# Patient Record
Sex: Male | Born: 1985 | Race: White | Hispanic: No | Marital: Single | Smoking: Current every day smoker
Health system: Southern US, Community
[De-identification: ages and names within clinical notes are randomized; demographics above are authoritative.]

## PROBLEM LIST (undated history)

## (undated) DIAGNOSIS — Z8669 Personal history of other diseases of the nervous system and sense organs: Secondary | ICD-10-CM

## (undated) HISTORY — PX: TONSILLECTOMY: SUR1361

## (undated) HISTORY — PX: NO PAST SURGERIES: SHX2092

## (undated) HISTORY — PX: OTHER SURGICAL HISTORY: SHX169

---

## 2006-07-18 ENCOUNTER — Emergency Department: Payer: Self-pay | Admitting: Emergency Medicine

## 2011-10-22 ENCOUNTER — Emergency Department: Payer: Self-pay | Admitting: Emergency Medicine

## 2016-10-02 ENCOUNTER — Emergency Department
Admission: EM | Admit: 2016-10-02 | Discharge: 2016-10-02 | Disposition: A | Discharge status: Home / Self Care | Payer: Self-pay | Attending: Emergency Medicine | Admitting: Emergency Medicine

## 2016-10-02 ENCOUNTER — Emergency Department: Payer: Self-pay

## 2016-10-02 ENCOUNTER — Encounter: Payer: Self-pay | Admitting: Emergency Medicine

## 2016-10-02 DIAGNOSIS — F17210 Nicotine dependence, cigarettes, uncomplicated: Secondary | ICD-10-CM | POA: Insufficient documentation

## 2016-10-02 DIAGNOSIS — R319 Hematuria, unspecified: Secondary | ICD-10-CM | POA: Insufficient documentation

## 2016-10-02 HISTORY — DX: Personal history of other diseases of the nervous system and sense organs: Z86.69

## 2016-10-02 LAB — URINALYSIS COMPLETE WITH MICROSCOPIC (ARMC ONLY)
BILIRUBIN URINE: NEGATIVE
Bacteria, UA: NONE SEEN
GLUCOSE, UA: NEGATIVE mg/dL
NITRITE: NEGATIVE
PH: 6 (ref 5.0–8.0)
Protein, ur: NEGATIVE mg/dL
Specific Gravity, Urine: 1.023 (ref 1.005–1.030)

## 2016-10-02 LAB — CBC
HCT: 46.9 % (ref 40.0–52.0)
HEMOGLOBIN: 16.3 g/dL (ref 13.0–18.0)
MCH: 31.6 pg (ref 26.0–34.0)
MCHC: 34.9 g/dL (ref 32.0–36.0)
MCV: 90.7 fL (ref 80.0–100.0)
PLATELETS: 206 10*3/uL (ref 150–440)
RBC: 5.16 MIL/uL (ref 4.40–5.90)
RDW: 13.1 % (ref 11.5–14.5)
WBC: 5.5 10*3/uL (ref 3.8–10.6)

## 2016-10-02 LAB — BASIC METABOLIC PANEL
Anion gap: 9 (ref 5–15)
BUN: 10 mg/dL (ref 6–20)
CALCIUM: 9.1 mg/dL (ref 8.9–10.3)
CHLORIDE: 109 mmol/L (ref 101–111)
CO2: 22 mmol/L (ref 22–32)
CREATININE: 1.16 mg/dL (ref 0.61–1.24)
GFR calc non Af Amer: >60 mL/min (ref >60)
GLUCOSE: 84 mg/dL (ref 65–99)
Potassium: 4 mmol/L (ref 3.5–5.1)
Sodium: 140 mmol/L (ref 135–145)

## 2016-10-02 NOTE — Discharge Instructions (Signed)
Please seek medical attention for any high fevers, chest pain, shortness of breath, change in behavior, persistent vomiting, bloody stool or any other new or concerning symptoms.  

## 2016-10-02 NOTE — ED Provider Notes (Signed)
[facility] Emergency Department Provider Note   ____________________________________________   I have reviewed the triage vital signs and the nursing notes.   HISTORY  Chief Complaint Hematuria   History limited by: Not Limited   HPI Wayne Lane is a 30 y.o. male who presents to the emergency department today because of concern for bloody urination. The patient states that it started last night after intercourse. He went to urinate and felt like he initially had a hard time urinating and then notice blood in his urine. This morning he again noticed a small amount of blood. He denies any significant pain. Had similar symptoms once previously that resolved on its own. Denies any GI bleed, nosebleeds or bleeding disorder.   Past Medical History:  Diagnosis Date  . History of seizures as a child     There are no active problems to display for this patient.   History reviewed. No pertinent surgical history.  Prior to Admission medications   Not on File    Allergies Valium [diazepam]  History reviewed. No pertinent family history.  Social History Social History  Substance Use Topics  . Smoking status: Current Every Day Smoker    Types: Cigarettes  . Smokeless tobacco: Never Used  . Alcohol use Yes     Comment: 6 pack/week    Review of Systems  Constitutional: Negative for fever. Cardiovascular: Negative for chest pain. Respiratory: Negative for shortness of breath. Gastrointestinal: Negative for abdominal pain, vomiting and diarrhea. Genitourinary: Negative for dysuria. Positive for hematuria. Musculoskeletal: Negative for back pain. Skin: Negative for rash. Neurological: Negative for headaches, focal weakness or numbness.  10-point ROS otherwise negative.  ____________________________________________   PHYSICAL EXAM:  VITAL SIGNS: ED Triage Vitals  Enc Vitals Group     BP 10/02/16 1105 109/70     Pulse Rate 10/02/16 1105  88     Resp 10/02/16 1105 20     Temp 10/02/16 1105 98.2 F (36.8 C)     Temp Source 10/02/16 1105 Oral     SpO2 10/02/16 1105 97 %     Weight 10/02/16 1106 145 lb (65.8 kg)     Height 10/02/16 1106 6\' 2"  (1.88 m)   Constitutional: Alert and oriented. Well appearing and in no distress. Eyes: Conjunctivae are normal. Normal extraocular movements. ENT   Head: Normocephalic and atraumatic.   Nose: No congestion/rhinnorhea.   Mouth/Throat: Mucous membranes are moist.   Neck: No stridor. Hematological/Lymphatic/Immunilogical: No cervical lymphadenopathy. Cardiovascular: Normal rate, regular rhythm.  No murmurs, rubs, or gallops. Respiratory: Normal respiratory effort without tachypnea nor retractions. Breath sounds are clear and equal bilaterally. No wheezes/rales/rhonchi. Gastrointestinal: Soft and nontender. No distention.  Genitourinary: Deferred Musculoskeletal: Normal range of motion in all extremities. No lower extremity edema. Neurologic:  Normal speech and language. No gross focal neurologic deficits are appreciated.  Skin:  Skin is warm, dry and intact. No rash noted. Psychiatric: Mood and affect are normal. Speech and behavior are normal. Patient exhibits appropriate insight and judgment.  ____________________________________________    LABS (pertinent positives/negatives)  Labs Reviewed  URINALYSIS COMPLETEWITH MICROSCOPIC ([facility] ONLY) - Abnormal; Notable for the following:       Result Value   Color, Urine YELLOW (*)    APPearance CLEAR (*)    Ketones, ur TRACE (*)    Hgb urine dipstick 1+ (*)    Leukocytes, UA TRACE (*)    Squamous Epithelial / LPF 0-5 (*)    All other components within normal limits  BASIC METABOLIC PANEL  CBC     ____________________________________________   EKG  None  ____________________________________________    RADIOLOGY  US renal   IMPRESSION:  Normal renal ultrasound for age.      ____________________________________________   PROCEDURES  Procedures  ____________________________________________   INITIAL IMPRESSION / ASSESSMENT AND PLAN / ED COURSE  Pertinent labs & imaging results that were available during my care of the patient were reviewed by me and considered in my medical decision making (see chart for details).  Patient here with gross hematuria. No clinical history suggestive of stones. UA here with some RBCs. Did obtain US renal without any concerning findings. At this point unclear etiology of the hematuria. Patient denies any dysuria or bad odor to suggest infection. Will discharge with urology follow up information.  ____________________________________________   FINAL CLINICAL IMPRESSION(S) / ED DIAGNOSES  Final diagnoses:  Hematuria, unspecified type     Note: This dictation was prepared with Dragon dictation. Any transcriptional errors that result from this process are unintentional    [name], MD 10/02/16 [phone]

## 2016-10-02 NOTE — ED Triage Notes (Signed)
Pt presents to ED c/o bright red bleeding and pain with urination intermittently since last night.

## 2016-10-02 NOTE — ED Notes (Signed)
Pt to u/s

## 2016-10-04 LAB — URINE CULTURE: Culture: <10000 — AB

## 2018-08-15 ENCOUNTER — Emergency Department
Admission: EM | Admit: 2018-08-15 | Discharge: 2018-08-15 | Disposition: A | Discharge status: Home / Self Care | Payer: Self-pay | Attending: Emergency Medicine | Admitting: Emergency Medicine

## 2018-08-15 ENCOUNTER — Emergency Department: Payer: Self-pay

## 2018-08-15 ENCOUNTER — Encounter: Payer: Self-pay | Admitting: Emergency Medicine

## 2018-08-15 DIAGNOSIS — Y939 Activity, unspecified: Secondary | ICD-10-CM | POA: Insufficient documentation

## 2018-08-15 DIAGNOSIS — S83412A Sprain of medial collateral ligament of left knee, initial encounter: Secondary | ICD-10-CM | POA: Insufficient documentation

## 2018-08-15 DIAGNOSIS — F17210 Nicotine dependence, cigarettes, uncomplicated: Secondary | ICD-10-CM | POA: Insufficient documentation

## 2018-08-15 DIAGNOSIS — X58XXXA Exposure to other specified factors, initial encounter: Secondary | ICD-10-CM | POA: Insufficient documentation

## 2018-08-15 DIAGNOSIS — Y929 Unspecified place or not applicable: Secondary | ICD-10-CM | POA: Insufficient documentation

## 2018-08-15 DIAGNOSIS — Y999 Unspecified external cause status: Secondary | ICD-10-CM | POA: Insufficient documentation

## 2018-08-15 MED ORDER — NAPROXEN 500 MG PO TABS: 500.0000 mg | ORAL_TABLET | Freq: Two times a day (BID) | ORAL | Status: DC

## 2018-08-15 MED ORDER — TRAMADOL HCL 50 MG PO TABS: 50.0000 mg | ORAL_TABLET | Freq: Two times a day (BID) | ORAL | 0 refills | Status: DC | PRN

## 2018-08-15 NOTE — ED Provider Notes (Signed)
[facility] Emergency Department Provider Note   ____________________________________________   First MD Initiated Contact with Patient 08/15/18 1044     (approximate)  I have reviewed the triage vital signs and the nursing notes.   HISTORY  Chief Complaint Knee Pain    HPI Wayne Lane is a 32 y.o. male patient presents with left knee pain which started yesterday.  Patient state no specific provocative incident for complaint.  Patient state pain started yesterday.  Patient states the pain is mainly medial aspect of his left knee.  Patient is able to ambulate with atypical gait.   Past Medical History:  Diagnosis Date  . History of seizures as a child     There are no active problems to display for this patient.   History reviewed. No pertinent surgical history.  Prior to Admission medications   Medication Sig Start Date End Date Taking? Authorizing Provider  naproxen (NAPROSYN) 500 MG tablet Take 1 tablet (500 mg total) by mouth 2 (two) times daily with a meal. 08/15/18   Joni Reining, PA-C  traMADol (ULTRAM) 50 MG tablet Take 1 tablet (50 mg total) by mouth every 12 (twelve) hours as needed. 08/15/18   Joni Reining, PA-C    Allergies Valium [diazepam]  No family history on file.  Social History Social History   Tobacco Use  . Smoking status: Current Every Day Smoker    Types: Cigarettes  . Smokeless tobacco: Never Used  Substance Use Topics  . Alcohol use: Yes    Comment: 6 pack/week  . Drug use: Yes    Types: Marijuana    Review of Systems Constitutional: No fever/chills Eyes: No visual changes. ENT: No sore throat. Cardiovascular: Denies chest pain. Respiratory: Denies shortness of breath. Gastrointestinal: No abdominal pain.  No nausea, no vomiting.  No diarrhea.  No constipation. Genitourinary: Negative for dysuria. Musculoskeletal: Left knee pain. Skin: Negative for rash. Neurological: Negative for headaches,  focal weakness or numbness.   ____________________________________________   PHYSICAL EXAM:  VITAL SIGNS: ED Triage Vitals  Enc Vitals Group     BP 08/15/18 1044 121/79     Pulse Rate 08/15/18 1044 73     Resp 08/15/18 1044 20     Temp 08/15/18 1044 98.4 F (36.9 C)     Temp Source 08/15/18 1044 Oral     SpO2 08/15/18 1044 100 %     Weight 08/15/18 1041 145 lb (65.8 kg)     Height 08/15/18 1041 6' (1.829 m)     Head Circumference --      Peak Flow --      Pain Score 08/15/18 1041 7     Pain Loc --      Pain Edu? --      Excl. in GC? --    Constitutional: Alert and oriented. Well appearing and in no acute distress. Cardiovascular: Normal rate, regular rhythm. Grossly normal heart sounds.  Good peripheral circulation. Respiratory: Normal respiratory effort.  No retractions. Lungs CTAB. Musculoskeletal: No obvious deformity of the left knee.  No obvious effusion or crepitus with palpation.  Patient has full and equal passive range of motion.  Moderate guarding palpation insertion point of the MCL. Neurologic:  Normal speech and language. No gross focal neurologic deficits are appreciated. No gait instability. Skin:  Skin is warm, dry and intact. No rash noted. Psychiatric: Mood and affect are normal. Speech and behavior are normal.  ____________________________________________   LABS (all labs ordered are  listed, but only abnormal results are displayed)  Labs Reviewed - No data to display ____________________________________________  EKG   ____________________________________________  RADIOLOGY  ED MD interpretation:    Official radiology report(s): Dg Knee Complete 4 Views Left  Result Date: 08/15/2018 CLINICAL DATA:  Pain without injury. EXAM: LEFT KNEE - COMPLETE 4+ VIEW COMPARISON:  None FINDINGS: No joint effusion. Mild sharpening of the tibial spines. The joint spaces appear well preserved. No fracture or dislocation. IMPRESSION: 1. Minimal degenerative  changes with mild sharpening of the tibial spines. 2. No acute findings. Electronically Signed   By: Signa Kell M.D.   On: 08/15/2018 11:08    ____________________________________________   PROCEDURES  Procedure(s) performed: None  Procedures  Critical Care performed: No  ____________________________________________   INITIAL IMPRESSION / ASSESSMENT AND PLAN / ED COURSE  As part of my medical decision making, I reviewed the following data within the electronic MEDICAL RECORD NUMBER    Left knee pain secondary to sprain.  Discussed negative x-ray findings with patient.  Patient placed in Ace wrap and given discharge care instruction.  Advised take medication as directed.  Patient advised to purchase over-the-counter elastic knee support to use while working.  Follow-up with orthopedic clinic if no improvement in 1 week.     ____________________________________________   FINAL CLINICAL IMPRESSION(S) / ED DIAGNOSES  Final diagnoses:  Sprain of medial collateral ligament of left knee, initial encounter     ED Discharge Orders         Ordered    naproxen (NAPROSYN) 500 MG tablet  2 times daily with meals     08/15/18 1123    traMADol (ULTRAM) 50 MG tablet  Every 12 hours PRN     08/15/18 1123           Note:  This document was prepared using Dragon voice recognition software and may include unintentional dictation errors.    Joni Reining, PA-C 08/15/18 1129    Sharman Cheek, MD 08/19/18 [phone]

## 2018-08-15 NOTE — Discharge Instructions (Signed)
Follow discharge care instruction wear elastic knee support while working.

## 2018-08-15 NOTE — ED Triage Notes (Signed)
Presents with left knee pain  States pain started yesterday  Unsure of injury  Pain is mainly lateral  Ambulates with slight limp

## 2018-10-24 ENCOUNTER — Other Ambulatory Visit: Payer: Self-pay

## 2018-10-24 ENCOUNTER — Encounter: Payer: Self-pay | Admitting: Emergency Medicine

## 2018-10-24 ENCOUNTER — Ambulatory Visit
Admission: EM | Admit: 2018-10-24 | Discharge: 2018-10-24 | Disposition: A | Discharge status: Home / Self Care | Payer: Self-pay | Attending: Family Medicine | Admitting: Family Medicine

## 2018-10-24 DIAGNOSIS — R69 Illness, unspecified: Secondary | ICD-10-CM | POA: Insufficient documentation

## 2018-10-24 DIAGNOSIS — J111 Influenza due to unidentified influenza virus with other respiratory manifestations: Secondary | ICD-10-CM

## 2018-10-24 LAB — RAPID INFLUENZA A&B ANTIGENS: Influenza A (ARMC): POSITIVE — AB

## 2018-10-24 LAB — RAPID INFLUENZA A&B ANTIGENS (ARMC ONLY): INFLUENZA B (ARMC): NEGATIVE

## 2018-10-24 MED ORDER — OSELTAMIVIR PHOSPHATE 75 MG PO CAPS: 75.0000 mg | ORAL_CAPSULE | Freq: Two times a day (BID) | ORAL | 0 refills | Status: DC

## 2018-10-24 NOTE — ED Triage Notes (Signed)
Pt c/o cough, congestion, fever (101 yesterday), and headache. Started  3-4 days ago.

## 2018-10-24 NOTE — ED Provider Notes (Signed)
[facility]    CSN: [id number] Arrival date & time: 10/24/18  1031     History   Chief Complaint Chief Complaint  Patient presents with  . Cough  . Fever    HPI Wayne Lane is a 32 y.o. male.   The history is provided by the patient.  Cough  Associated symptoms: fever and myalgias   Associated symptoms: no weight loss   Fever  Associated symptoms: congestion, cough and myalgias   URI  Presenting symptoms: congestion, cough, fatigue and fever   Severity:  Moderate Onset quality:  Sudden Duration:  2 days Timing:  Constant Progression:  Worsening Chronicity:  New Relieved by:  None tried Ineffective treatments:  None tried Associated symptoms: myalgias   Risk factors: sick contacts   Risk factors: not elderly, no chronic cardiac disease, no chronic kidney disease, no chronic respiratory disease, no diabetes mellitus, no immunosuppression, no recent illness and no recent travel     Past Medical History:  Diagnosis Date  . History of seizures as a child     There are no active problems to display for this patient.   Past Surgical History:  Procedure Laterality Date  . NO PAST SURGERIES         Home Medications    Prior to Admission medications   Medication Sig Start Date End Date Taking? Authorizing Provider  naproxen (NAPROSYN) 500 MG tablet Take 1 tablet (500 mg total) by mouth 2 (two) times daily with a meal. 08/15/18   Joni ReiningSmith, Ronald K, PA-C  oseltamivir (TAMIFLU) 75 MG capsule Take 1 capsule (75 mg total) by mouth 2 (two) times daily. 10/24/18   Payton Mccallumonty, Alaija Ruble, MD  traMADol (ULTRAM) 50 MG tablet Take 1 tablet (50 mg total) by mouth every 12 (twelve) hours as needed. 08/15/18   Joni ReiningSmith, Ronald K, PA-C    Family History Family History  Problem Relation Age of Onset  . Healthy Mother     Social History Social History   Tobacco Use  . Smoking status: Current Every Day Smoker    Packs/day: 0.50    Types: Cigarettes  . Smokeless  tobacco: Never Used  Substance Use Topics  . Alcohol use: Yes    Comment: 1-2 drinks of liquor a day  . Drug use: Not Currently    Types: Marijuana     Allergies   Valium [diazepam]   Review of Systems Review of Systems  Constitutional: Positive for fatigue and fever. Negative for weight loss.  HENT: Positive for congestion.   Respiratory: Positive for cough.   Musculoskeletal: Positive for myalgias.     Physical Exam Triage Vital Signs ED Triage Vitals  Enc Vitals Group     BP 10/24/18 1045 (!) 128/94     Pulse Rate 10/24/18 1045 (!) 102     Resp 10/24/18 1045 18     Temp 10/24/18 1045 100.2 F (37.9 C)     Temp Source 10/24/18 1045 Oral     SpO2 10/24/18 1045 100 %     Weight 10/24/18 1040 145 lb (65.8 kg)     Height 10/24/18 1040 6' (1.829 m)     Head Circumference --      Peak Flow --      Pain Score 10/24/18 1040 8     Pain Loc --      Pain Edu? --      Excl. in GC? --    No data found.  Updated Vital Signs BP Marland Kitchen(!)  128/94 (BP Location: Left Arm)   Pulse (!) 102   Temp 100.2 F (37.9 C) (Oral)   Resp 18   Ht 6' (1.829 m)   Wt 65.8 kg   SpO2 100%   BMI 19.67 kg/m   Visual Acuity Right Eye Distance:   Left Eye Distance:   Bilateral Distance:    Right Eye Near:   Left Eye Near:    Bilateral Near:     Physical Exam  Constitutional: He appears well-developed and well-nourished. No distress.  HENT:  Head: Normocephalic and atraumatic.  Right Ear: Tympanic membrane, external ear and ear canal normal.  Left Ear: Tympanic membrane, external ear and ear canal normal.  Nose: Nose normal.  Mouth/Throat: Uvula is midline, oropharynx is clear and moist and mucous membranes are normal. No oropharyngeal exudate or tonsillar abscesses.  Eyes: Pupils are equal, round, and reactive to light. Conjunctivae and EOM are normal. Right eye exhibits no discharge. Left eye exhibits no discharge. No scleral icterus.  Neck: Normal range of motion. Neck supple. No  tracheal deviation present. No thyromegaly present.  Cardiovascular: Normal rate, regular rhythm and normal heart sounds.  Pulmonary/Chest: Effort normal and breath sounds normal. No stridor. No respiratory distress. He has no wheezes. He has no rales. He exhibits no tenderness.  Lymphadenopathy:    He has no cervical adenopathy.  Neurological: He is alert.  Skin: Skin is warm and dry. No rash noted. He is not diaphoretic.  Nursing note and vitals reviewed.    UC Treatments / Results  Labs (all labs ordered are listed, but only abnormal results are displayed) Labs Reviewed  RAPID INFLUENZA A&B ANTIGENS ([facility] ONLY)    EKG None  Radiology No results found.  Procedures Procedures (including critical care time)  Medications Ordered in UC Medications - No data to display  Initial Impression / Assessment and Plan / UC Course  I have reviewed the triage vital signs and the nursing notes.  Pertinent labs & imaging results that were available during my care of the patient were reviewed by me and considered in my medical decision making (see chart for details).      Final Clinical Impressions(s) / UC Diagnoses   Final diagnoses:  Influenza-like illness     Discharge Instructions     Rest, fluids, tylenol/advil    ED Prescriptions    Medication Sig Dispense Auth. Provider   oseltamivir (TAMIFLU) 75 MG capsule Take 1 capsule (75 mg total) by mouth 2 (two) times daily. 10 capsule Payton Mccallum, MD     1.  diagnosis reviewed with patient 2. rx as per orders above; reviewed possible side effects, interactions, risks and benefits  3. Recommend supportive treatment as above  4. Follow-up prn if symptoms worsen or don't improve   Controlled Substance Prescriptions [place] Controlled Substance Registry consulted? Not Applicable   Payton Mccallum, MD 10/24/18 [phone]

## 2018-10-24 NOTE — Discharge Instructions (Signed)
Rest, fluids, tylenol/advil °

## 2018-10-26 ENCOUNTER — Telehealth (HOSPITAL_COMMUNITY): Payer: Self-pay | Admitting: Emergency Medicine

## 2018-10-26 NOTE — Telephone Encounter (Signed)
[facility]. Attempted to contact patient. No answer.

## 2019-02-23 ENCOUNTER — Other Ambulatory Visit: Payer: Self-pay

## 2019-02-23 ENCOUNTER — Emergency Department
Admission: EM | Admit: 2019-02-23 | Discharge: 2019-02-23 | Disposition: A | Discharge status: Home / Self Care | Payer: Self-pay | Attending: Student in an Organized Health Care Education/Training Program | Admitting: Student in an Organized Health Care Education/Training Program

## 2019-02-23 ENCOUNTER — Encounter: Payer: Self-pay | Admitting: Emergency Medicine

## 2019-02-23 DIAGNOSIS — M7712 Lateral epicondylitis, left elbow: Secondary | ICD-10-CM | POA: Insufficient documentation

## 2019-02-23 DIAGNOSIS — F17210 Nicotine dependence, cigarettes, uncomplicated: Secondary | ICD-10-CM | POA: Insufficient documentation

## 2019-02-23 MED ORDER — IBUPROFEN 600 MG PO TABS: 600.0000 mg | ORAL_TABLET | Freq: Three times a day (TID) | ORAL | 0 refills | Status: DC | PRN

## 2019-02-23 NOTE — Discharge Instructions (Addendum)
Follow-up with your primary care provider or can no clinic acute care if any continued problems.  Begin taking ibuprofen 600 mg 3 times daily with food.  While you are in [facility] picking up your prescription also go to the sports section in the pharmacy area where you will see tennis elbow splints which is a band that you put at your arm like we discussed.  You may also use ice to help calm this area down.  Decreased lifting with your left arm if you are not wearing a tennis elbow splint.  You may also follow-up with Dr. Rexanne Mano who is the orthopedist on call if any worsening.  His office is in [facility] and his contact information is listed on your discharge papers.

## 2019-02-23 NOTE — ED Provider Notes (Signed)
[facility] Emergency Department Provider Note  ____________________________________________   First MD Initiated Contact with Patient 02/23/19 1010     (approximate)  I have reviewed the triage vital signs and the nursing notes.   HISTORY  Chief Complaint Arm Injury   HPI Wayne Lane is a 33 y.o. male presents to the ED with complaint of pain to his left forearm without history of injury.  Patient states that he lifts heavy items at work.  He has not taken any over-the-counter medications.  He states that pain is worse with lifting or squeezing his hand.  He denies any paresthesias.  Patient is right-hand dominant.  He rates his pain as a 5/10.     Past Medical History:  Diagnosis Date  . History of seizures as a child     There are no active problems to display for this patient.   Past Surgical History:  Procedure Laterality Date  . NO PAST SURGERIES      Prior to Admission medications   Medication Sig Start Date End Date Taking? Authorizing Provider  ibuprofen (ADVIL,MOTRIN) 600 MG tablet Take 1 tablet (600 mg total) by mouth every 8 (eight) hours as needed for moderate pain. 02/23/19   Tommi RumpsSummers, Rhonda L, PA-C    Allergies Valium [diazepam]  Family History  Problem Relation Age of Onset  . Healthy Mother     Social History Social History   Tobacco Use  . Smoking status: Current Every Day Smoker    Packs/day: 0.50    Types: Cigarettes  . Smokeless tobacco: Never Used  Substance Use Topics  . Alcohol use: Yes    Comment: 1-2 drinks of liquor a day  . Drug use: Not Currently    Types: Marijuana    Review of Systems Constitutional: No fever/chills Cardiovascular: Denies chest pain. Respiratory: Denies shortness of breath. Musculoskeletal: Pain left forearm/elbow. Skin: Negative for rash. Neurological: Negative for  focal weakness or numbness. ___________________________________________   PHYSICAL EXAM:  VITAL SIGNS:  ED Triage Vitals  Enc Vitals Group     BP 02/23/19 1005 (!) 130/94     Pulse Rate 02/23/19 1005 80     Resp 02/23/19 1005 18     Temp 02/23/19 1005 98.2 F (36.8 C)     Temp Source 02/23/19 1005 Oral     SpO2 02/23/19 1005 100 %     Weight 02/23/19 1007 150 lb (68 kg)     Height 02/23/19 1007 6\' 2"  (1.88 m)     Head Circumference --      Peak Flow --      Pain Score 02/23/19 1006 5     Pain Loc --      Pain Edu? --      Excl. in GC? --     Constitutional: Alert and oriented. Well appearing and in no acute distress. Eyes: Conjunctivae are normal.  Head: Atraumatic. Neck: No stridor.   Cardiovascular:   Good peripheral circulation. Respiratory: Normal respiratory effort.  No retractions. Musculoskeletal: On examination of the left forearm there is marked tenderness on palpation of the lateral epicondyle.  Patient increased pain with making a fist with his left hand.  There is no soft tissue edema or evidence of discoloration to indicate an injury.  Pulses present.  Motor sensory function intact.  Capillary refill is less than 3 seconds.  Range of motion otherwise is without restriction. Neurologic:  Normal speech and language. No gross focal neurologic deficits are appreciated.  Skin:  Skin is warm, dry and intact. Psychiatric: Mood and affect are normal. Speech and behavior are normal.  ____________________________________________   LABS (all labs ordered are listed, but only abnormal results are displayed)  Labs Reviewed - No data to display  PROCEDURES  Procedure(s) performed (including Critical Care):  Procedures   ____________________________________________   INITIAL IMPRESSION / ASSESSMENT AND PLAN / ED COURSE  As part of my medical decision making, I reviewed the following data within the electronic MEDICAL RECORD NUMBER Notes from prior ED visits and [place] Controlled Substance Database  33 year old male presents to the ED with complaint of left forearm pain for  approximately 1 week without history of injury.  Patient states that pain is worse with lifting or making a fist.  Patient is right-hand dominant.  He has not taken any over-the-counter medication.  On exam there is marked tenderness on palpation of the left lateral epicondylar area.  This consistent with lateral epicondylitis.  Patient was made aware.  He will try to obtain a tennis elbow brace.  A prescription for ibuprofen 600 mg every 8 hours was sent to his pharmacy.  He was also made aware that he could use ice to the area and if not improving he is to follow-up with orthopedics.  ____________________________________________   FINAL CLINICAL IMPRESSION(S) / ED DIAGNOSES  Final diagnoses:  Lateral epicondylitis of left elbow     ED Discharge Orders         Ordered    ibuprofen (ADVIL,MOTRIN) 600 MG tablet  Every 8 hours PRN,   Status:  Discontinued     02/23/19 1027    ibuprofen (ADVIL,MOTRIN) 600 MG tablet  Every 8 hours PRN     02/23/19 1027           Note:  This document was prepared using Dragon voice recognition software and may include unintentional dictation errors.    Tommi Rumps, PA-C 02/23/19 1413    Willy Eddy, MD 02/23/19 [phone]

## 2019-02-23 NOTE — ED Notes (Signed)

## 2019-02-23 NOTE — ED Triage Notes (Signed)
Pt here for pain to area of extensor tendon on left.  Similar sx of tennis elbow. Has been lifting heavy items. Pain worse when squeezes hand or lifts objects.  Pain for last week.

## 2019-02-23 NOTE — ED Notes (Signed)
First Nurse Note: Patient complaining of left elbow pain, denies known injury.

## 2019-04-03 ENCOUNTER — Emergency Department
Admission: EM | Admit: 2019-04-03 | Discharge: 2019-04-03 | Disposition: A | Discharge status: Home / Self Care | Payer: Self-pay | Attending: Emergency Medicine | Admitting: Emergency Medicine

## 2019-04-03 ENCOUNTER — Other Ambulatory Visit: Payer: Self-pay

## 2019-04-03 DIAGNOSIS — M654 Radial styloid tenosynovitis [de Quervain]: Secondary | ICD-10-CM | POA: Insufficient documentation

## 2019-04-03 DIAGNOSIS — F17210 Nicotine dependence, cigarettes, uncomplicated: Secondary | ICD-10-CM | POA: Insufficient documentation

## 2019-04-03 NOTE — ED Provider Notes (Signed)
[facility] Emergency Department Provider Note ____________________________________________  Time seen: 1055  I have reviewed the triage vital signs and the nursing notes.  HISTORY  Chief Complaint  Wrist Pain  HPI Wayne Lane is a 33 y.o. right-handed male presents himself to the ED with a 1 day complaint of left hand pain.  Patient denies any trauma, crush injury, laceration, or contusion.  Reports work activities of a repetitive nature.  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  He describes standing at the machine with the products coming from his left side.  He is to grab the products and placed him in a box.  His repetitive pinching motion on the left is likely what led to his thumb pain.  He reports tenderness to the radial wrist as well as increased pain with pinching and gripping mechanism.  He denies any fevers, chills, or sweats.  Patient has been on the job for approximately 2 days, after being furloughed/laid off for the last 3 months from the same position.  Past Medical History:  Diagnosis Date  . History of seizures as a child     There are no active problems to display for this patient.   Past Surgical History:  Procedure Laterality Date  . NO PAST SURGERIES      Prior to Admission medications   Not on File    Allergies Valium [diazepam]  Family History  Problem Relation Age of Onset  . Healthy Mother     Social History Social History   Tobacco Use  . Smoking status: Current Every Day Smoker    Packs/day: 0.50    Types: Cigarettes  . Smokeless tobacco: Never Used  Substance Use Topics  . Alcohol use: Yes    Comment: 1-2 drinks of liquor a day  . Drug use: Not Currently    Types: Marijuana    Review of Systems  Constitutional: Negative for fever. Cardiovascular: Negative for chest pain. Respiratory: Negative for shortness of breath. Gastrointestinal: Negative for abdominal pain, vomiting and  diarrhea. Genitourinary: Negative for dysuria. Musculoskeletal: Negative for back pain. Left wrist pain as above Skin: Negative for rash. Neurological: Negative for headaches, focal weakness or numbness. ____________________________________________  PHYSICAL EXAM:  VITAL SIGNS: ED Triage Vitals [04/03/19 1026]  Enc Vitals Group     BP 103/75     Pulse Rate 74     Resp 18     Temp      Temp Source Oral     SpO2 99 %     Weight 150 lb (68 kg)     Height 6\' 2"  (1.88 m)     Head Circumference      Peak Flow      Pain Score 6     Pain Loc      Pain Edu?      Excl. in GC?     Constitutional: Alert and oriented. Well appearing and in no distress. Head: Normocephalic and atraumatic. Eyes: Conjunctivae are normal. Normal extraocular movements Cardiovascular: Normal rate, regular rhythm. Normal distal pulses. Respiratory: Normal respiratory effort. No wheezes/rales/rhonchi. Gastrointestinal: Soft and nontender. No distention. Musculoskeletal: Left wrist with subtle swelling over the EPL.  Patient is also tender to palpation over the distal radial wrist.  He has a positive Finkelstein exam.  Normal composite fist and grip strength otherwise.  Nontender with normal range of motion in all extremities.  Neurologic:  Normal intrinsic testing.  Normal gross sensation.  Normal speech and  language. No gross focal neurologic deficits are appreciated. Skin:  Skin is warm, dry and intact. No rash noted. Psychiatric: Mood and affect are normal. Patient exhibits appropriate insight and judgment. ____________________________________________   RADIOLOGY Not indicated  ____________________________________________  PROCEDURES  .Splint Application Date/Time: 04/03/2019 11:10 AM Performed by: Lissa Hoard, PA-C Authorized by: Lissa Hoard, PA-C   Consent:    Consent obtained:  Verbal   Consent given by:  Patient   Alternatives discussed:  Observation Pre-procedure  details:    Sensation:  Normal Procedure details:    Laterality:  Left   Location:  Wrist   Wrist:  L wrist   Splint type:  Thumb spica   Supplies:  Prefabricated splint Post-procedure details:    Pain:  Improved   Sensation:  Normal   Patient tolerance of procedure:  Tolerated well, no immediate complications   ____________________________________________  INITIAL IMPRESSION / ASSESSMENT AND PLAN / ED COURSE  GAUDENCIO USCANGA was evaluated in Emergency Department on 04/03/2019 for the symptoms described in the history of present illness. He was evaluated in the context of the global COVID-19 pandemic, which necessitated consideration that the patient might be at risk for infection with the SARS-CoV-2 virus that causes COVID-19. Institutional protocols and algorithms that pertain to the evaluation of patients at risk for COVID-19 are in a state of rapid change based on information released by regulatory bodies including the CDC and federal and state organizations. These policies and algorithms were followed during the patient's care in the ED.  Patient with ED evaluation of a 1 day complaint of left radial wrist pain at the return to his repetitive motion job.  Patient clinical picture is consistent with a probable de Quervain's tenosynovitis.  He is placed in a prefab thumb spica splint for support.  He is advised to take over-the-counter ibuprofen for pain and inflammation relief.  He is to apply ice and/or warm compresses to promote symptom relief.  He will be referred to Ortho for ongoing or refractory symptoms. ____________________________________________  FINAL CLINICAL IMPRESSION(S) / ED DIAGNOSES  Final diagnoses:  Tenosynovitis, de Arlana Hove, PA-C 04/03/19 1115    Sharman Cheek, MD 04/04/19 1537

## 2019-04-03 NOTE — Discharge Instructions (Addendum)
Your exam is consistent with thumb tendinitis or DeQuervain's tenosynovitis. It can be brought on by repetitive pinching & grasping. Wear the thumb splint for most activities, except sleep. Apply ice and moist epsom salt soaks to promote healing. Take OTC ibuprofen or naproxen for pain and inflammation relief. Follow-up with Dr. Rosita Kea for ongoing symptoms.

## 2019-04-03 NOTE — ED Notes (Signed)
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

## 2019-04-03 NOTE — ED Triage Notes (Signed)
Pt c/o left wrist pain since yesterday, states he does repetitive work

## 2019-04-16 ENCOUNTER — Telehealth: Payer: Self-pay | Admitting: Family Medicine

## 2019-04-16 ENCOUNTER — Encounter: Payer: Self-pay | Admitting: Emergency Medicine

## 2019-04-16 ENCOUNTER — Other Ambulatory Visit: Payer: Self-pay

## 2019-04-16 ENCOUNTER — Emergency Department
Admission: EM | Admit: 2019-04-16 | Discharge: 2019-04-16 | Disposition: A | Discharge status: Home / Self Care | Payer: Self-pay | Attending: Emergency Medicine | Admitting: Emergency Medicine

## 2019-04-16 DIAGNOSIS — J069 Acute upper respiratory infection, unspecified: Secondary | ICD-10-CM | POA: Insufficient documentation

## 2019-04-16 DIAGNOSIS — F17210 Nicotine dependence, cigarettes, uncomplicated: Secondary | ICD-10-CM | POA: Insufficient documentation

## 2019-04-16 DIAGNOSIS — Z20828 Contact with and (suspected) exposure to other viral communicable diseases: Secondary | ICD-10-CM | POA: Insufficient documentation

## 2019-04-16 DIAGNOSIS — G40909 Epilepsy, unspecified, not intractable, without status epilepticus: Secondary | ICD-10-CM | POA: Insufficient documentation

## 2019-04-16 MED ORDER — ALBUTEROL SULFATE HFA 108 (90 BASE) MCG/ACT IN AERS
2.0000 | INHALATION_SPRAY | Freq: Once | RESPIRATORY_TRACT | Status: AC
  Administered 2019-04-16: 2 via RESPIRATORY_TRACT
  Filled 2019-04-16: qty 6.7

## 2019-04-16 MED ORDER — BENZONATATE 100 MG PO CAPS: 100.0000 mg | ORAL_CAPSULE | Freq: Three times a day (TID) | ORAL | 0 refills | Status: AC | PRN

## 2019-04-16 NOTE — Telephone Encounter (Signed)
Appt made

## 2019-04-16 NOTE — Telephone Encounter (Signed)
Received an after hours call at 6:40 am this morning from [name] about this patient who was calling c/o a cough since yesterday. Dry, no other sxs. No known sick contacts. Does not have a PCP, wanting advice on next steps. Offered him a virtual OV or to go to UC if sxs worsened and he expressed interest in an OV. Please get him scheduled for a virtual visit

## 2019-04-16 NOTE — ED Notes (Signed)
Pt reports dry cough x2-3 days - denies SHOB, dizziness, runny nose, body aches, N/V Pt denies Covid exposure

## 2019-04-16 NOTE — ED Provider Notes (Signed)
[facility] EMERGENCY DEPARTMENT Provider Note   CSN: [id number] Arrival date & time: 04/16/19  1239    History   Chief Complaint Chief Complaint  Patient presents with  . Cough  . Shortness of Breath    HPI Wayne Lane is a 33 y.o. male.     HPI 33 year old male with past medical history of seizure disorder as a child here with cough and shortness of breath.  The patient states that his symptoms for approximately week and half ago.  He states he began to have mildly loose stools.  No blood in his stool.  He states that over the last several days, the diarrhea has lightened up.  He has no overt abdominal pain, nausea, vomiting.  No fevers.  He states that several days, is developed a mildly dry cough.  No sputum production.  He has some intermittent shortness of breath with exertion, but states is not necessarily abnormal for him as he has a childhood history of asthma.  He does continue to smoke as well.  Denies any fevers or chills.  No known sick contacts.  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  No known COVID exposures at home.  No lower extremity swelling. Symptoms worse w/ cold exposure. No alleviating factors.  Past Medical History:  Diagnosis Date  . History of seizures as a child     There are no active problems to display for this patient.   Past Surgical History:  Procedure Laterality Date  . NO PAST SURGERIES          Home Medications    Prior to Admission medications   Medication Sig Start Date End Date Taking? Authorizing Provider  benzonatate (TESSALON PERLES) 100 MG capsule Take 1 capsule (100 mg total) by mouth 3 (three) times daily as needed for up to 7 days for cough. 04/16/19 04/23/19  Shaune PollackIsaacs, Jozi Malachi, MD    Family History Family History  Problem Relation Age of Onset  . Healthy Mother     Social History Social History   Tobacco Use  . Smoking status: Current Every Day Smoker   Packs/day: 0.50    Types: Cigarettes  . Smokeless tobacco: Never Used  Substance Use Topics  . Alcohol use: Yes    Comment: 1-2 drinks of liquor a day  . Drug use: Not Currently    Types: Marijuana     Allergies   Valium [diazepam]   Review of Systems Review of Systems  Constitutional: Positive for fatigue. Negative for chills and fever.  HENT: Negative for congestion and rhinorrhea.   Eyes: Negative for visual disturbance.  Respiratory: Positive for cough and wheezing. Negative for shortness of breath.   Cardiovascular: Negative for chest pain and leg swelling.  Gastrointestinal: Positive for diarrhea (Now resolved). Negative for abdominal pain, nausea and vomiting.  Genitourinary: Negative for dysuria and flank pain.  Musculoskeletal: Negative for neck pain and neck stiffness.  Skin: Negative for rash and wound.  Allergic/Immunologic: Negative for immunocompromised state.  Neurological: Positive for weakness. Negative for syncope and headaches.  All other systems reviewed and are negative.    Physical Exam Updated Vital Signs BP 122/87   Pulse 84   Temp 99 F (37.2 C) (Oral)   Resp 18   Ht 6' (1.829 m)   Wt 68 kg   SpO2 99%   BMI 20.34 kg/m   Physical Exam Vitals signs and nursing note reviewed.  Constitutional:  General: He is not in acute distress.    Appearance: He is well-developed.  HENT:     Head: Normocephalic and atraumatic.     Comments: Mild posterior pharyngeal erythema.  No tonsillar exudates. Eyes:     Conjunctiva/sclera: Conjunctivae normal.  Neck:     Musculoskeletal: Neck supple.  Cardiovascular:     Rate and Rhythm: Normal rate and regular rhythm.     Heart sounds: Normal heart sounds. No murmur. No friction rub.  Pulmonary:     Effort: Pulmonary effort is normal. No respiratory distress.     Breath sounds: Examination of the right-lower field reveals wheezing. Examination of the left-lower field reveals wheezing. Wheezing (Scant,  on prolonged expiration only) present. No rales.  Abdominal:     General: There is no distension.     Palpations: Abdomen is soft.     Tenderness: There is no abdominal tenderness.  Skin:    General: Skin is warm.     Capillary Refill: Capillary refill takes less than 2 seconds.  Neurological:     Mental Status: He is alert and oriented to person, place, and time.     Motor: No abnormal muscle tone.      ED Treatments / Results  Labs (all labs ordered are listed, but only abnormal results are displayed) Labs Reviewed  NOVEL CORONAVIRUS, NAA (HOSPITAL ORDER, SEND-OUT TO REF LAB)    EKG None  Radiology No results found.  Procedures Procedures (including critical care time)  Medications Ordered in ED Medications  albuterol (VENTOLIN HFA) 108 (90 Base) MCG/ACT inhaler 2 puff (has no administration in time range)     Initial Impression / Assessment and Plan / ED Course  I have reviewed the triage vital signs and the nursing notes.  Pertinent labs & imaging results that were available during my care of the patient were reviewed by me and considered in my medical decision making (see chart for details).       33 yo M here with mild cough, SOB. Here for clearance to go back to work.  On exam, he is mild wheezing which I suspect is due to his tobacco use and history of asthma.  Has no focal lung findings, is satting 99% on room air with normal work of breathing, and I do not suspect pneumonia or clinically significant coronavirus infection.  Will send a coronavirus for work clearance, advised him to follow this up as an outpatient.  Otherwise, I suspect he has benign viral illness versus COPD/asthma exacerbation.  Will hold on steroids in the setting of only minimal wheezing, normal work of breathing, and possible coronavirus.  Home quarantine precautions discussed.  Wayne Lane was evaluated in Emergency Department on 04/16/2019 for the symptoms described in the history of  present illness. He was evaluated in the context of the global COVID-19 pandemic, which necessitated consideration that the patient might be at risk for infection with the SARS-CoV-2 virus that causes COVID-19. Institutional protocols and algorithms that pertain to the evaluation of patients at risk for COVID-19 are in a state of rapid change based on information released by regulatory bodies including the CDC and federal and state organizations. These policies and algorithms were followed during the patient's care in the ED.   Final Clinical Impressions(s) / ED Diagnoses   Final diagnoses:  Viral URI with cough    ED Discharge Orders         Ordered    benzonatate (TESSALON PERLES) 100 MG capsule  3 times daily PRN     04/16/19 1405           Shaune Pollack, MD 04/16/19 1422

## 2019-04-16 NOTE — Discharge Instructions (Addendum)
Use the inhaler 2 puffs every 4-6 hours as needed for wheezing or shortness of breath

## 2019-04-16 NOTE — ED Triage Notes (Signed)
Cough and SOB x 2 days.  

## 2019-04-17 LAB — NOVEL CORONAVIRUS, NAA (HOSP ORDER, SEND-OUT TO REF LAB; TAT 18-24 HRS): SARS-CoV-2, NAA: NOT DETECTED

## 2019-04-18 ENCOUNTER — Other Ambulatory Visit: Payer: Self-pay

## 2019-04-18 ENCOUNTER — Ambulatory Visit (INDEPENDENT_AMBULATORY_CARE_PROVIDER_SITE_OTHER): Payer: Self-pay | Admitting: Family Medicine

## 2019-04-18 ENCOUNTER — Encounter: Payer: Self-pay | Admitting: Family Medicine

## 2019-04-18 VITALS — Ht 72.0 in | Wt 150.0 lb

## 2019-04-18 DIAGNOSIS — J4520 Mild intermittent asthma, uncomplicated: Secondary | ICD-10-CM

## 2019-04-18 DIAGNOSIS — J45909 Unspecified asthma, uncomplicated: Secondary | ICD-10-CM | POA: Insufficient documentation

## 2019-04-18 DIAGNOSIS — J3089 Other allergic rhinitis: Secondary | ICD-10-CM

## 2019-04-18 DIAGNOSIS — Z7689 Persons encountering health services in other specified circumstances: Secondary | ICD-10-CM

## 2019-04-18 DIAGNOSIS — R197 Diarrhea, unspecified: Secondary | ICD-10-CM

## 2019-04-18 DIAGNOSIS — J309 Allergic rhinitis, unspecified: Secondary | ICD-10-CM | POA: Insufficient documentation

## 2019-04-18 DIAGNOSIS — R059 Cough, unspecified: Secondary | ICD-10-CM

## 2019-04-18 DIAGNOSIS — R05 Cough: Secondary | ICD-10-CM

## 2019-04-18 NOTE — Progress Notes (Signed)
Ht 6' (1.829 m)   Wt 150 lb (68 kg)   BMI 20.34 kg/m    Subjective:    Patient ID: Wayne Lane, male    DOB: [birth year], 33 y.o.   MRN: [id number]  HPI: Wayne Lane is a 33 y.o. male  Chief Complaint  Patient presents with  . Establish Care    pt would like to discuss about covid 19    . This visit was completed via WebEx due to the restrictions of the COVID-19 pandemic. All issues as above were discussed and addressed. Physical exam was done as above through visual confirmation on WebEx. If it was felt that the patient should be evaluated in the office, they were directed there. The patient verbally consented to this visit. . Location of the patient: home . Location of the provider: work . Those involved with this call:  . Provider: Roosvelt Maserachel Ryott Rafferty, PA-C . CMA: Elton SinAnita Quito, CMA . Front Desk/Registration: Harriet PhoJoliza Johnson  . Time spent on call: 20 minutes with patient face to face via video conference. More than 50% of this time was spent in counseling and coordination of care. 5 minutes total spent in review of patient's record and preparation of their chart. I verified patient identity using two factors (patient name and date of birth). Patient consents verbally to being seen via telemedicine visit today.   Patient presenting today to establish care. Past medical history significant for asthma that seems to go along with allergy seasons. Does not take anything on a regular basis for these. States he gets sick every single year twice a year.   Main concern today is several days of diarrhea, cough, mild chest tightness, malaise. Given COVID 19 issues, was very concerned so went to ER 2 nights ago. COVID 19 testing negative, exam and other findings benign. Given tessalon perles and albuterol inhaler for viral URI/asthma exacerbation and notes things are improving. Still having some post-nasal drainage, cough, chest tightness. Denies fevers, chills, SOB, myalgias. Unsure if he's had  sick contacts. Has not been trying anything OTC.   Gets sick twice a year every year.   Relevant past medical, surgical, family and social history reviewed and updated as indicated. Interim medical history since our last visit reviewed. Allergies and medications reviewed and updated.  Review of Systems  Per HPI unless specifically indicated above     Objective:    Ht 6' (1.829 m)   Wt 150 lb (68 kg)   BMI 20.34 kg/m   Wt Readings from Last 3 Encounters:  04/18/19 150 lb (68 kg)  04/16/19 150 lb (68 kg)  04/03/19 150 lb (68 kg)    Physical Exam Vitals signs and nursing note reviewed.  Constitutional:      General: He is not in acute distress.    Appearance: Normal appearance.  HENT:     Head: Atraumatic.     Right Ear: External ear normal.     Left Ear: External ear normal.     Nose: Nose normal. No congestion.     Mouth/Throat:     Mouth: Mucous membranes are moist.     Pharynx: Oropharynx is clear.  Eyes:     Extraocular Movements: Extraocular movements intact.     Conjunctiva/sclera: Conjunctivae normal.  Neck:     Musculoskeletal: Normal range of motion.  Pulmonary:     Effort: Pulmonary effort is normal. No respiratory distress.  Musculoskeletal: Normal range of motion.  Skin:    General: Skin is  dry.     Findings: No erythema or rash.  Neurological:     Mental Status: He is oriented to person, place, and time.  Psychiatric:        Mood and Affect: Mood normal.        Thought Content: Thought content normal.        Judgment: Judgment normal.     Results for orders placed or performed during the hospital encounter of 04/16/19  Novel Coronavirus,NAA,(SEND-OUT TO REF LAB - TAT 24-48 hrs); Hosp Order  Result Value Ref Range   SARS-CoV-2, NAA NOT DETECTED NOT DETECTED   Coronavirus Source NASOPHARYNGEAL       Assessment & Plan:   Problem List Items Addressed This Visit      Respiratory   Allergic rhinitis - Primary    Discussed starting zyrtec and  flonase regimen, supportive care advice given      Asthma    Continue albuterol inhaler prn, return precautions given if breathing worsens      Relevant Medications   Albuterol Sulfate 108 (90 Base) MCG/ACT AEPB    Other Visit Diagnoses    Encounter to establish care       Diarrhea, unspecified type       Imodium, push fluids, brat diet. F/u if sxs not improving    Cough       Covid testing negative, but still symptomatic. Work note given for a few more days home, supportive care reviewed. F/u if not resolving       Follow up plan: Return for CPE.

## 2019-04-22 NOTE — Assessment & Plan Note (Signed)
Discussed starting zyrtec and flonase regimen, supportive care advice given

## 2019-04-22 NOTE — Assessment & Plan Note (Signed)
Continue albuterol inhaler prn, return precautions given if breathing worsens

## 2019-04-26 ENCOUNTER — Encounter: Payer: Self-pay | Admitting: Family Medicine

## 2019-04-30 ENCOUNTER — Encounter: Payer: Self-pay | Admitting: Family Medicine

## 2019-05-01 ENCOUNTER — Encounter: Payer: Self-pay | Admitting: Family Medicine

## 2019-07-08 ENCOUNTER — Emergency Department: Payer: Self-pay

## 2019-07-08 ENCOUNTER — Emergency Department
Admission: EM | Admit: 2019-07-08 | Discharge: 2019-07-08 | Disposition: A | Discharge status: Home / Self Care | Payer: Self-pay | Attending: Emergency Medicine | Admitting: Emergency Medicine

## 2019-07-08 ENCOUNTER — Other Ambulatory Visit: Payer: Self-pay

## 2019-07-08 DIAGNOSIS — W500XXA Accidental hit or strike by another person, initial encounter: Secondary | ICD-10-CM | POA: Insufficient documentation

## 2019-07-08 DIAGNOSIS — Z79899 Other long term (current) drug therapy: Secondary | ICD-10-CM | POA: Insufficient documentation

## 2019-07-08 DIAGNOSIS — F17210 Nicotine dependence, cigarettes, uncomplicated: Secondary | ICD-10-CM | POA: Insufficient documentation

## 2019-07-08 DIAGNOSIS — S20212A Contusion of left front wall of thorax, initial encounter: Secondary | ICD-10-CM | POA: Insufficient documentation

## 2019-07-08 DIAGNOSIS — J45909 Unspecified asthma, uncomplicated: Secondary | ICD-10-CM | POA: Insufficient documentation

## 2019-07-08 DIAGNOSIS — Y9389 Activity, other specified: Secondary | ICD-10-CM | POA: Insufficient documentation

## 2019-07-08 DIAGNOSIS — Y999 Unspecified external cause status: Secondary | ICD-10-CM | POA: Insufficient documentation

## 2019-07-08 DIAGNOSIS — Y929 Unspecified place or not applicable: Secondary | ICD-10-CM | POA: Insufficient documentation

## 2019-07-08 MED ORDER — TRAMADOL HCL 50 MG PO TABS: 50.0000 mg | ORAL_TABLET | Freq: Four times a day (QID) | ORAL | 0 refills | Status: AC | PRN

## 2019-07-08 MED ORDER — MELOXICAM 15 MG PO TABS: 15.0000 mg | ORAL_TABLET | Freq: Every day | ORAL | 1 refills | Status: AC
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
## 2019-07-08 NOTE — ED Provider Notes (Signed)
[facility] Emergency Department Provider Note  ____________________________________________  Time seen: Approximately 9:03 PM  I have reviewed the triage vital signs and the nursing notes.   HISTORY  Chief Complaint Rib Injury    HPI Wayne Lane is a 33 y.o. male presents to the emergency department with left-sided lateral rib pain after patient reports that he was playing with his 28-year-old son and son landed on anterior chest wall.  Patient states that he has had pain with deep inspiration and palpation on the affected side.  No chest tightness or shortness of breath.  Patient is here to see if he has a broken rib.  He denies abdominal pain or vomiting.        Past Medical History:  Diagnosis Date  . History of seizures as a child     Patient Active Problem List   Diagnosis Date Noted  . Allergic rhinitis 04/18/2019  . Asthma 04/18/2019    Past Surgical History:  Procedure Laterality Date  . heart murmur    . NO PAST SURGERIES      Prior to Admission medications   Medication Sig Start Date End Date Taking? Authorizing Provider  Albuterol Sulfate 108 (90 Base) MCG/ACT AEPB Inhale into the lungs 2 (two) times daily. As needed    [provider]  meloxicam (MOBIC) 15 MG tablet Take 1 tablet (15 mg total) by mouth daily for 7 days. 07/08/19 07/15/19  Lannie Fields, PA-C  traMADol (ULTRAM) 50 MG tablet Take 1 tablet (50 mg total) by mouth every 6 (six) hours as needed for up to 3 days. 07/08/19 07/11/19  Lannie Fields, PA-C    Allergies Valium [diazepam]  Family History  Problem Relation Age of Onset  . Healthy Mother   . Hypertension Father   . Hypertension Brother     Social History Social History   Tobacco Use  . Smoking status: Current Every Day Smoker    Packs/day: 0.25    Types: Cigarettes  . Smokeless tobacco: Never Used  Substance Use Topics  . Alcohol use: Yes    Comment: 1-2 drinks of liquor a day  . Drug  use: Not Currently    Types: Marijuana     Review of Systems  Constitutional: No fever/chills Eyes: No visual changes. No discharge ENT: No upper respiratory complaints. Cardiovascular: Patient has left sided chest wall pain.  Respiratory: no cough. No SOB. Gastrointestinal: No abdominal pain.  No nausea, no vomiting.  No diarrhea.  No constipation. Genitourinary: Negative for dysuria. No hematuria Musculoskeletal: Negative for musculoskeletal pain. Skin: Negative for rash, abrasions, lacerations, ecchymosis. Neurological: Negative for headaches, focal weakness or numbness.   ____________________________________________   PHYSICAL EXAM:  VITAL SIGNS: ED Triage Vitals  Enc Vitals Group     BP 07/08/19 1757 (!) 132/91     Pulse Rate 07/08/19 1757 100     Resp 07/08/19 1757 17     Temp 07/08/19 1757 100.3 F (37.9 C)     Temp Source 07/08/19 1757 Oral     SpO2 07/08/19 1757 98 %     Weight 07/08/19 1758 150 lb (68 kg)     Height 07/08/19 1758 6' (1.829 m)     Head Circumference --      Peak Flow --      Pain Score 07/08/19 1807 7     Pain Loc --      Pain Edu? --      Excl. in [place]? --  Constitutional: Alert and oriented. Well appearing and in no acute distress. Eyes: Conjunctivae are normal. PERRL. EOMI. Head: Atraumatic. ENT:        Nose: No congestion/rhinnorhea.      Mouth/Throat: Mucous membranes are moist.  Neck: No stridor.  No cervical spine tenderness to palpation. Cardiovascular: Normal rate, regular rhythm. Normal S1 and S2.  Good peripheral circulation.  Patient has left-sided anterior chest wall pain to palpation. Respiratory: Normal respiratory effort without tachypnea or retractions. Lungs CTAB. Good air entry to the bases with no decreased or absent breath sounds. Gastrointestinal: Bowel sounds 4 quadrants. Soft and nontender to palpation.  No left upper quadrant pain.  No guarding or rigidity. No palpable masses. No distention. No CVA  tenderness. Musculoskeletal: Full range of motion to all extremities. No gross deformities appreciated. Neurologic:  Normal speech and language. No gross focal neurologic deficits are appreciated.  Skin:  Skin is warm, dry and intact. No rash noted. Psychiatric: Mood and affect are normal. Speech and behavior are normal. Patient exhibits appropriate insight and judgement.   ____________________________________________   LABS (all labs ordered are listed, but only abnormal results are displayed)  Labs Reviewed - No data to display ____________________________________________  EKG   ____________________________________________  RADIOLOGY I personally viewed and evaluated these images as part of my medical decision making, as well as reviewing the written report by the radiologist.  Dg Ribs Unilateral W/chest Left  Result Date: 07/08/2019 CLINICAL DATA:  Left rib pain/injury EXAM: LEFT RIBS AND CHEST - 3+ VIEW COMPARISON:  None. FINDINGS: Lungs are clear.  No pleural effusion or pneumothorax. The heart is normal in size. No displaced left rib fracture is seen. IMPRESSION: No evidence of acute cardiopulmonary disease. No displaced left rib fracture is seen. Electronically Signed   By: Charline BillsSriyesh  Krishnan M.D.   On: 07/08/2019 19:55    ____________________________________________    PROCEDURES  Procedure(s) performed:    Procedures    Medications - No data to display   ____________________________________________   INITIAL IMPRESSION / ASSESSMENT AND PLAN / ED COURSE  Pertinent labs & imaging results that were available during my care of the patient were reviewed by me and considered in my medical decision making (see chart for details).  Review of the [place] CSRS was performed in accordance of the [facility] prior to dispensing any controlled drugs.          Assessment and plan Chest wall pain 33 year old male presents to the emergency department with left-sided rib pain  after he was playing with his son.  Patient was mildly hypertensive at triage but vital signs were otherwise reassuring.  X-ray examination of the left ribs revealed no evidence of pneumothorax or acute fracture.  Patient was discharged with tramadol and meloxicam for likely chest wall contusion.  Return precautions were given.  All patient questions were answered.   ____________________________________________  FINAL CLINICAL IMPRESSION(S) / ED DIAGNOSES  Final diagnoses:  Contusion of left chest wall, initial encounter      NEW MEDICATIONS STARTED DURING THIS VISIT:  ED Discharge Orders         Ordered    traMADol (ULTRAM) 50 MG tablet  Every 6 hours PRN     07/08/19 2053    meloxicam (MOBIC) 15 MG tablet  Daily     07/08/19 2053              This chart was dictated using voice recognition software/Dragon. Despite best efforts to proofread, errors can occur which can  change the meaning. Any change was purely unintentional.    Gasper LloydWoods, Devyon Keator M, PA-C 07/08/19 2113    Dionne BucySiadecki, Sebastian, MD 07/08/19 [phone]

## 2019-07-08 NOTE — ED Notes (Signed)
Patient reports his son jumped at him playing and hit his ribs. Patient c/o left ribcage pain.

## 2019-07-08 NOTE — ED Notes (Signed)
Reviewed discharge instructions, follow-up care, and prescriptions with patient. Patient verbalized understanding of all information reviewed. Patient stable, with no distress noted at this time.    

## 2019-07-09 ENCOUNTER — Other Ambulatory Visit: Payer: Self-pay

## 2019-07-09 DIAGNOSIS — Z20822 Contact with and (suspected) exposure to covid-19: Secondary | ICD-10-CM

## 2019-07-10 LAB — NOVEL CORONAVIRUS, NAA: SARS-CoV-2, NAA: NOT DETECTED

## 2019-07-11 ENCOUNTER — Telehealth: Payer: Self-pay | Admitting: General Practice

## 2019-07-11 NOTE — Telephone Encounter (Signed)
Negative COVID results given. Patient results "NOT Detected." Caller expressed understanding. ° °

## 2020-07-03 ENCOUNTER — Other Ambulatory Visit: Payer: Self-pay

## 2020-07-03 ENCOUNTER — Encounter: Payer: Self-pay | Admitting: Emergency Medicine

## 2020-07-03 ENCOUNTER — Emergency Department
Admission: EM | Admit: 2020-07-03 | Discharge: 2020-07-03 | Disposition: A | Discharge status: Home / Self Care | Payer: Self-pay | Attending: Emergency Medicine | Admitting: Emergency Medicine

## 2020-07-03 DIAGNOSIS — F17210 Nicotine dependence, cigarettes, uncomplicated: Secondary | ICD-10-CM | POA: Insufficient documentation

## 2020-07-03 DIAGNOSIS — L731 Pseudofolliculitis barbae: Secondary | ICD-10-CM | POA: Insufficient documentation

## 2020-07-03 DIAGNOSIS — J45909 Unspecified asthma, uncomplicated: Secondary | ICD-10-CM | POA: Insufficient documentation

## 2020-07-03 DIAGNOSIS — Z7951 Long term (current) use of inhaled steroids: Secondary | ICD-10-CM | POA: Insufficient documentation

## 2020-07-03 MED ORDER — SULFAMETHOXAZOLE-TRIMETHOPRIM 800-160 MG PO TABS: 1.0000 | ORAL_TABLET | Freq: Two times a day (BID) | ORAL | 0 refills | Status: DC

## 2020-07-03 MED ORDER — NEOSPORIN PLUS PAIN RELIEF MS 3.5-10000-10 EX CREA: TOPICAL_CREAM | Freq: Two times a day (BID) | CUTANEOUS | 0 refills | Status: DC

## 2020-07-03 NOTE — Discharge Instructions (Signed)
Follow discharge care instructions and take medication as directed. 

## 2020-07-03 NOTE — ED Provider Notes (Signed)
[facility] Emergency Department Provider Note   ____________________________________________   First MD Initiated Contact with Patient 07/03/20 1058     (approximate)  I have reviewed the triage vital signs and the nursing notes.   HISTORY  Chief Complaint Abscess    HPI Wayne Lane is a 34 y.o. male patient complain of access to the left groin for 2 days.  Patient states lesion started as a hair bump irritation on the left scrotum.  State lesion has become painful.  Patient denies drainage.  Patient denies fever or dysuria.  Rates his pain as 8/10.  Described pain as "sore".  No palliative measure for complaint.         Past Medical History:  Diagnosis Date  . History of seizures as a child     Patient Active Problem List   Diagnosis Date Noted  . Allergic rhinitis 04/18/2019  . Asthma 04/18/2019    Past Surgical History:  Procedure Laterality Date  . heart murmur    . NO PAST SURGERIES      Prior to Admission medications   Medication Sig Start Date End Date Taking? Authorizing Provider  Albuterol Sulfate 108 (90 Base) MCG/ACT AEPB Inhale into the lungs 2 (two) times daily. As needed    [provider]  neomycin-polymyxin-pramoxine (NEOSPORIN PLUS) 1 % cream Apply topically 2 (two) times daily. 07/03/20   Joni Reining, PA-C  sulfamethoxazole-trimethoprim (BACTRIM DS) 800-160 MG tablet Take 1 tablet by mouth 2 (two) times daily. 07/03/20   Joni Reining, PA-C    Allergies Valium [diazepam]  Family History  Problem Relation Age of Onset  . Healthy Mother   . Hypertension Father   . Hypertension Brother     Social History Social History   Tobacco Use  . Smoking status: Current Every Day Smoker    Packs/day: 0.25    Types: Cigarettes  . Smokeless tobacco: Never Used  Vaping Use  . Vaping Use: Every day  Substance Use Topics  . Alcohol use: Yes    Comment: 1-2 drinks of liquor a day  . Drug use: Not  Currently    Types: Marijuana    Review of Systems Constitutional: No fever/chills Eyes: No visual changes. ENT: No sore throat. Cardiovascular: Denies chest pain. Respiratory: Denies shortness of breath. Gastrointestinal: No abdominal pain.  No nausea, no vomiting.  No diarrhea.  No constipation. Genitourinary: Negative for dysuria. Musculoskeletal: Negative for back pain. Skin: Positive for papular lesion left scrotum area. Neurological: Negative for headaches, focal weakness or numbness. Allergic/Immunilogical: Valium  ____________________________________________   PHYSICAL EXAM:  VITAL SIGNS: ED Triage Vitals [07/03/20 1039]  Enc Vitals Group     BP      Pulse      Resp      Temp      Temp src      SpO2      Weight 149 lb 14.6 oz (68 kg)     Height 6' (1.829 m)     Head Circumference      Peak Flow      Pain Score 8     Pain Loc      Pain Edu?      Excl. in [place]?     Constitutional: Alert and oriented. Well appearing and in no acute distress. Cardiovascular: Normal rate, regular rhythm. Grossly normal heart sounds.  Good peripheral circulation. Respiratory: Normal respiratory effort.  No retractions. Lungs CTAB. Neurologic:  Normal speech and language.  No gross focal neurologic deficits are appreciated. No gait instability. Skin:  Skin is warm, dry and intact.  Papular lesion on erythematous base left scrotal area.   Psychiatric: Mood and affect are normal. Speech and behavior are normal.  ____________________________________________   LABS (all labs ordered are listed, but only abnormal results are displayed)  Labs Reviewed - No data to display ____________________________________________  EKG   ____________________________________________  RADIOLOGY  ED MD interpretation:    Official radiology report(s): No results found.  ____________________________________________   PROCEDURES  Procedure(s) performed (including Critical  Care):  Procedures   ____________________________________________   INITIAL IMPRESSION / ASSESSMENT AND PLAN / ED COURSE  As part of my medical decision making, I reviewed the following data within the electronic MEDICAL RECORD NUMBER     Patient presents with a papular lesion on erythematous base in the scrotum area.  Patient complaint physical exam consistent with infected ingrown hair.  Patient given discharge care instruction advised take medication as directed.  Patient [facility].        Wayne Lane was evaluated in Emergency Department on 07/03/2020 for the symptoms described in the history of present illness. He was evaluated in the context of the global COVID-19 pandemic, which necessitated consideration that the patient might be at risk for infection with the SARS-CoV-2 virus that causes COVID-19. Institutional protocols and algorithms that pertain to the evaluation of patients at risk for COVID-19 are in a state of rapid change based on information released by regulatory bodies including the CDC and federal and state organizations. These policies and algorithms were followed during the patient's care in the ED.    ____________________________________________   FINAL CLINICAL IMPRESSION(S) / ED DIAGNOSES  Final diagnoses:  Ingrown hair     ED Discharge Orders         Ordered    neomycin-polymyxin-pramoxine (NEOSPORIN PLUS) 1 % cream  2 times daily        07/03/20 1112    sulfamethoxazole-trimethoprim (BACTRIM DS) 800-160 MG tablet  2 times daily        07/03/20 1112           Note:  This document was prepared using Dragon voice recognition software and may include unintentional dictation errors.    Joni Reining, PA-C 07/03/20 1132    Sharyn Creamer, MD 07/03/20 [phone]

## 2020-07-03 NOTE — ED Notes (Signed)
See triage note  Presents with possible abscess area to groin area States he noticed area about 2 days ago.

## 2020-07-03 NOTE — ED Triage Notes (Signed)
C/O abscess to left groin x 2 days.

## 2020-08-28 ENCOUNTER — Encounter: Payer: Self-pay | Admitting: Emergency Medicine

## 2020-08-28 ENCOUNTER — Emergency Department: Payer: Self-pay

## 2020-08-28 ENCOUNTER — Emergency Department
Admission: EM | Admit: 2020-08-28 | Discharge: 2020-08-28 | Disposition: A | Discharge status: Home / Self Care | Payer: Self-pay | Attending: Emergency Medicine | Admitting: Emergency Medicine

## 2020-08-28 ENCOUNTER — Other Ambulatory Visit: Payer: Self-pay

## 2020-08-28 DIAGNOSIS — S62635A Displaced fracture of distal phalanx of left ring finger, initial encounter for closed fracture: Secondary | ICD-10-CM | POA: Insufficient documentation

## 2020-08-28 DIAGNOSIS — Y9289 Other specified places as the place of occurrence of the external cause: Secondary | ICD-10-CM | POA: Insufficient documentation

## 2020-08-28 DIAGNOSIS — F17210 Nicotine dependence, cigarettes, uncomplicated: Secondary | ICD-10-CM | POA: Insufficient documentation

## 2020-08-28 DIAGNOSIS — S67192A Crushing injury of right middle finger, initial encounter: Secondary | ICD-10-CM | POA: Insufficient documentation

## 2020-08-28 DIAGNOSIS — Y9389 Activity, other specified: Secondary | ICD-10-CM | POA: Insufficient documentation

## 2020-08-28 DIAGNOSIS — Y998 Other external cause status: Secondary | ICD-10-CM | POA: Insufficient documentation

## 2020-08-28 DIAGNOSIS — J45909 Unspecified asthma, uncomplicated: Secondary | ICD-10-CM | POA: Insufficient documentation

## 2020-08-28 DIAGNOSIS — S61309A Unspecified open wound of unspecified finger with damage to nail, initial encounter: Secondary | ICD-10-CM

## 2020-08-28 DIAGNOSIS — S62639A Displaced fracture of distal phalanx of unspecified finger, initial encounter for closed fracture: Secondary | ICD-10-CM

## 2020-08-28 DIAGNOSIS — S61302A Unspecified open wound of right middle finger with damage to nail, initial encounter: Secondary | ICD-10-CM | POA: Insufficient documentation

## 2020-08-28 DIAGNOSIS — Z23 Encounter for immunization: Secondary | ICD-10-CM | POA: Insufficient documentation

## 2020-08-28 DIAGNOSIS — S6710XA Crushing injury of unspecified finger(s), initial encounter: Secondary | ICD-10-CM

## 2020-08-28 DIAGNOSIS — W230XXA Caught, crushed, jammed, or pinched between moving objects, initial encounter: Secondary | ICD-10-CM | POA: Insufficient documentation

## 2020-08-28 MED ORDER — TETANUS-DIPHTH-ACELL PERTUSSIS 5-2.5-18.5 LF-MCG/0.5 IM SUSP
0.5000 mL | Freq: Once | INTRAMUSCULAR | Status: AC
  Administered 2020-08-28: 0.5 mL via INTRAMUSCULAR
  Filled 2020-08-28: qty 0.5

## 2020-08-28 MED ORDER — LIDOCAINE HCL (PF) 1 % IJ SOLN
5.0000 mL | Freq: Once | INTRAMUSCULAR | Status: AC
  Administered 2020-08-28: 5 mL
  Filled 2020-08-28: qty 5

## 2020-08-28 NOTE — ED Provider Notes (Signed)
[facility] Emergency Department Provider Note ____________________________________________  Time seen: 1324  I have reviewed the triage vital signs and the nursing notes.  HISTORY  Chief Complaint  Finger Injury  HPI Wayne Lane is a 34 y.o. male presents himself to the ED for evaluation of accidental crush injury to the right middle finger.  Patient was using a tire changing machine, when he accidentally crushed his right middle finger.  He presents with partial nail avulsion, and swelling to the tip of the finger.  He denies any other injury at this time he reports an unknown tetanus status.   Past Medical History:  Diagnosis Date  . History of seizures as a child     Patient Active Problem List   Diagnosis Date Noted  . Allergic rhinitis 04/18/2019  . Asthma 04/18/2019    Past Surgical History:  Procedure Laterality Date  . heart murmur    . NO PAST SURGERIES      Prior to Admission medications   Medication Sig Start Date End Date Taking? Authorizing Provider  Albuterol Sulfate 108 (90 Base) MCG/ACT AEPB Inhale into the lungs 2 (two) times daily. As needed    [provider]  neomycin-polymyxin-pramoxine (NEOSPORIN PLUS) 1 % cream Apply topically 2 (two) times daily. 07/03/20   Joni Reining, PA-C  sulfamethoxazole-trimethoprim (BACTRIM DS) 800-160 MG tablet Take 1 tablet by mouth 2 (two) times daily. 07/03/20   Joni Reining, PA-C    Allergies Valium [diazepam]  Family History  Problem Relation Age of Onset  . Healthy Mother   . Hypertension Father   . Hypertension Brother     Social History Social History   Tobacco Use  . Smoking status: Current Every Day Smoker    Packs/day: 0.25    Types: Cigarettes  . Smokeless tobacco: Never Used  Vaping Use  . Vaping Use: Every day  Substance Use Topics  . Alcohol use: Yes    Comment: 1-2 drinks of liquor a day  . Drug use: Not Currently    Types: Marijuana    Review of  Systems  Constitutional: Negative for fever. Cardiovascular: Negative for chest pain. Respiratory: Negative for shortness of breath. Musculoskeletal: Negative for back pain. Crush injury to the right middle finger Skin: Negative for rash. Neurological: Negative for headaches, focal weakness or numbness. ____________________________________________  PHYSICAL EXAM:  VITAL SIGNS: ED Triage Vitals  Enc Vitals Group     BP 08/28/20 1257 (!) 132/94     Pulse Rate 08/28/20 1257 66     Resp 08/28/20 1257 18     Temp 08/28/20 1257 98.1 F (36.7 C)     Temp Source 08/28/20 1257 Oral     SpO2 --      Weight 08/28/20 1237 155 lb (70.3 kg)     Height 08/28/20 1237 6\' 2"  (1.88 m)     Head Circumference --      Peak Flow --      Pain Score 08/28/20 1237 7     Pain Loc --      Pain Edu? --      Excl. in [place]? --     Constitutional: Alert and oriented. Well appearing and in no distress. Head: Normocephalic and atraumatic. Eyes: Conjunctivae are normal. Normal extraocular movements Cardiovascular: Normal rate, regular rhythm. Normal distal pulses. Respiratory: Normal respiratory effort. No wheezes/rales/rhonchi. Musculoskeletal: Left hand without obvious deformity or dislocation.  Patient does have a soft tissue injury to the left middle finger.  Soft tissue swelling noted to the distal phalanx and partial nail avulsion is appreciated.  Patient with normal composite fist on the left. nontender with normal range of motion in all extremities.  Neurologic:  Normal gross sensation. Normal speech and language. No gross focal neurologic deficits are appreciated. Skin:  Skin is warm, dry and intact. No rash noted. ____________________________________________   RADIOLOGY  DG Left Hand   IMPRESSION: Fracture of the tuft of the distal fourth phalanx. ____________________________________________  PROCEDURES  Tdap 0.5 ml IM  .Nail Removal  Date/Time: 08/28/2020 3:02 PM Performed by: Lissa Hoard, PA-C Authorized by: Lissa Hoard, PA-C   Consent:    Consent obtained:  Verbal   Consent given by:  Patient   Risks discussed:  Bleeding, pain and permanent nail deformity Location:    Hand:  R long finger Pre-procedure details:    Skin preparation:  Betadine Anesthesia (see MAR for exact dosages):    Anesthesia method: transthecal block. Nail Removal:    Nail removed:  Complete   Nail bed repaired: yes     Nail bed repair material:  5-0 vicryl   Number of sutures:  4   Removed nail replaced and anchored: no   Trephination:    Subungual hematoma drained: no   Ingrown nail:    Wedge excision of skin: no     Nail matrix removed or ablated:  None Post-procedure details:    Dressing:  Petrolatum-impregnated gauze and splint   Patient tolerance of procedure:  Tolerated well, no immediate complications  ____________________________________________  INITIAL IMPRESSION / ASSESSMENT AND PLAN / ED COURSE  Patient with ED evaluation management and initial fracture care of a closed tuft fracture and partial nailbed avulsion.  Patient also sustained a nailbed laceration.  Wounds were repaired as appropriate and he is appropriately splinted.  Wound care instructions and supplies are provided.  He will follow-up with local urgent care or return to the ED if needed.  Wayne Lane was evaluated in Emergency Department on 08/28/2020 for the symptoms described in the history of present illness. He was evaluated in the context of the global COVID-19 pandemic, which necessitated consideration that the patient might be at risk for infection with the SARS-CoV-2 virus that causes COVID-19. Institutional protocols and algorithms that pertain to the evaluation of patients at risk for COVID-19 are in a state of rapid change based on information released by regulatory bodies including the CDC and federal and state organizations. These policies and algorithms were followed during  the patient's care in the ED. ____________________________________________  FINAL CLINICAL IMPRESSION(S) / ED DIAGNOSES  Final diagnoses:  Crushing injury of finger, initial encounter  Closed fracture of tuft of distal phalanx of finger  Traumatic avulsion of nail plate of finger, initial encounter      Lissa Hoard, PA-C 08/28/20 1543    Sharman Cheek, MD 08/30/20 [phone]

## 2020-08-28 NOTE — ED Triage Notes (Signed)
Pt reports that she smashed his left hand ring finger while working on something. Pt states not a work related injury

## 2020-08-28 NOTE — ED Notes (Signed)
Pt states he poked holes in his finger with a needle to relieve pressure. Pt states latest tetanus shot was 5 years ago.

## 2020-08-28 NOTE — Discharge Instructions (Addendum)
You have a fracture to the tuft of the fingertip.  Also has a nailbed laceration which has been repaired with dissolvable sutures.  Keep the wound clean, dry, and covered.  Wear the finger splint as needed for support.  You may take over-the-counter Tylenol and Motrin as needed for pain relief.  Follow-up with your primary provider return to the ED if needed for wound check.

## 2020-08-28 NOTE — ED Notes (Signed)
Pt states he took excedrin and 1 sulfamethoxazole/trimethopr 800/160 before coming to the ER

## 2021-02-24 ENCOUNTER — Emergency Department
Admission: EM | Admit: 2021-02-24 | Discharge: 2021-02-24 | Disposition: A | Discharge status: Home / Self Care | Payer: Self-pay | Attending: Emergency Medicine | Admitting: Emergency Medicine

## 2021-02-24 ENCOUNTER — Encounter: Payer: Self-pay | Admitting: *Deleted

## 2021-02-24 ENCOUNTER — Other Ambulatory Visit: Payer: Self-pay

## 2021-02-24 DIAGNOSIS — J45909 Unspecified asthma, uncomplicated: Secondary | ICD-10-CM | POA: Insufficient documentation

## 2021-02-24 DIAGNOSIS — Z79899 Other long term (current) drug therapy: Secondary | ICD-10-CM | POA: Insufficient documentation

## 2021-02-24 DIAGNOSIS — L259 Unspecified contact dermatitis, unspecified cause: Secondary | ICD-10-CM | POA: Insufficient documentation

## 2021-02-24 DIAGNOSIS — F17210 Nicotine dependence, cigarettes, uncomplicated: Secondary | ICD-10-CM | POA: Insufficient documentation

## 2021-02-24 DIAGNOSIS — R221 Localized swelling, mass and lump, neck: Secondary | ICD-10-CM | POA: Insufficient documentation

## 2021-02-24 NOTE — ED Provider Notes (Signed)
[facility] Emergency Department Provider Note   ____________________________________________   Event Date/Time   First MD Initiated Contact with Patient 02/24/21 1904     (approximate)  I have reviewed the triage vital signs and the nursing notes.   HISTORY  Chief Complaint Rash and Abscess    HPI Wayne Lane is a 35 y.o. male with no significant past medical history who presents to the ED complaining of rash.  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  Shortly afterwards, he started to notice raised red bumps to both of his forearms.  He denies any itching or pain to the areas and has not noticed any drainage from the rash.  He denies any fevers or peeling of skin.  His mother was additionally concerned about a spot on the left side of his neck.  He has noticed a small nontender nodule to his left neck for about the past year.  It has not changed in size and has had no drainage.  He denies any rashes associated with this nodule.        Past Medical History:  Diagnosis Date  . History of seizures as a child     Patient Active Problem List   Diagnosis Date Noted  . Allergic rhinitis 04/18/2019  . Asthma 04/18/2019    Past Surgical History:  Procedure Laterality Date  . heart murmur    . NO PAST SURGERIES      Prior to Admission medications   Medication Sig Start Date End Date Taking? Authorizing Provider  Albuterol Sulfate 108 (90 Base) MCG/ACT AEPB Inhale into the lungs 2 (two) times daily. As needed    [provider]  neomycin-polymyxin-pramoxine (NEOSPORIN PLUS) 1 % cream Apply topically 2 (two) times daily. 07/03/20   Joni Reining, PA-C  sulfamethoxazole-trimethoprim (BACTRIM DS) 800-160 MG tablet Take 1 tablet by mouth 2 (two) times daily. 07/03/20   Joni Reining, PA-C    Allergies Valium [diazepam]  Family History  Problem Relation Age of Onset  .  Healthy Mother   . Hypertension Father   . Hypertension Brother     Social History Social History   Tobacco Use  . Smoking status: Current Every Day Smoker    Packs/day: 0.25    Types: Cigarettes  . Smokeless tobacco: Never Used  Vaping Use  . Vaping Use: Every day  Substance Use Topics  . Alcohol use: Yes    Comment: 1-2 drinks of liquor a day  . Drug use: Not Currently    Types: Marijuana    Review of Systems  Constitutional: No fever/chills Eyes: No visual changes. ENT: No sore throat. Cardiovascular: Denies chest pain. Respiratory: Denies shortness of breath. Gastrointestinal: No abdominal pain.  No nausea, no vomiting.  No diarrhea.  No constipation. Genitourinary: Negative for dysuria. Musculoskeletal: Negative for back pain. Skin: Positive for rash. Neurological: Negative for headaches, focal weakness or numbness.  ____________________________________________   PHYSICAL EXAM:  VITAL SIGNS: ED Triage Vitals [02/24/21 1843]  Enc Vitals Group     BP 127/84     Pulse Rate 85     Resp 18     Temp 98.6 F (37 C)     Temp Source Oral     SpO2 100 %     Weight 150 lb (68 kg)     Height 6\' 2"  (1.88 m)     Head Circumference  Peak Flow      Pain Score 1     Pain Loc      Pain Edu?      Excl. in GC?     Constitutional: Alert and oriented. Eyes: Conjunctivae are normal. Head: Atraumatic. Nose: No congestion/rhinnorhea. Mouth/Throat: Mucous membranes are moist. Neck: Normal ROM.  Small mobile nontender nodule to left lateral neck with no fluctuance or induration. Cardiovascular: Normal rate, regular rhythm. Grossly normal heart sounds. Respiratory: Normal respiratory effort.  No retractions. Lungs CTAB. Gastrointestinal: Soft and nontender. No distention. Genitourinary: deferred Musculoskeletal: No lower extremity tenderness nor edema. Neurologic:  Normal speech and language. No gross focal neurologic deficits are appreciated. Skin:  Skin is warm,  dry and intact.  Red maculopapular rash to bilateral forearms with no vesicles or purulence.  No skin sloughing noted. Psychiatric: Mood and affect are normal. Speech and behavior are normal.  ____________________________________________   LABS (all labs ordered are listed, but only abnormal results are displayed)  Labs Reviewed - No data to display   PROCEDURES  Procedure(s) performed (including Critical Care):  Procedures   ____________________________________________   INITIAL IMPRESSION / ASSESSMENT AND PLAN / ED COURSE       35 year old male with no significant past medical history presents to the ED complaining of 2 days of rash to his bilateral forearms as well as 1 year of nodule to his left neck.  Nodule at his left neck appears consistent with a lipoma, no associated cellulitis or fluctuance to suggest abscess.  He appears to have a contact dermatitis to his bilateral forearms, no evidence of cellulitis or skin sloughing.  Rash is blanchable and not consistent with petechia.  He was counseled to trial antihistamines for rash and to return to the ED for any new or worsening symptoms.  Patient agrees with plan.      ____________________________________________   FINAL CLINICAL IMPRESSION(S) / ED DIAGNOSES  Final diagnoses:  Contact dermatitis, unspecified contact dermatitis type, unspecified trigger     ED Discharge Orders    None       Note:  This document was prepared using Dragon voice recognition software and may include unintentional dictation errors.   Chesley Noon, MD 02/24/21 [phone]

## 2021-02-24 NOTE — ED Triage Notes (Signed)
Pt reports a bump on his neck for 1 year.  Pt also has a rash on both arms since yesterday.  No itching.  Pt alert  Speech clear.

## 2021-07-22 ENCOUNTER — Other Ambulatory Visit: Payer: Self-pay

## 2021-07-22 ENCOUNTER — Emergency Department
Admission: EM | Admit: 2021-07-22 | Discharge: 2021-07-22 | Disposition: A | Discharge status: Home / Self Care | Payer: Self-pay | Attending: Emergency Medicine | Admitting: Emergency Medicine

## 2021-07-22 DIAGNOSIS — F17210 Nicotine dependence, cigarettes, uncomplicated: Secondary | ICD-10-CM | POA: Insufficient documentation

## 2021-07-22 DIAGNOSIS — J45909 Unspecified asthma, uncomplicated: Secondary | ICD-10-CM | POA: Insufficient documentation

## 2021-07-22 DIAGNOSIS — N342 Other urethritis: Secondary | ICD-10-CM | POA: Insufficient documentation

## 2021-07-22 LAB — URINALYSIS, COMPLETE (UACMP) WITH MICROSCOPIC
Bilirubin Urine: NEGATIVE
Glucose, UA: NEGATIVE mg/dL
Hgb urine dipstick: NEGATIVE
Nitrite: NEGATIVE
Protein, ur: NEGATIVE mg/dL
Specific Gravity, Urine: >1.03 — ABNORMAL HIGH (ref 1.005–1.030)
WBC, UA: >50 WBC/hpf (ref 0–5)
pH: 7 (ref 5.0–8.0)

## 2021-07-22 MED ORDER — AMOXICILLIN-POT CLAVULANATE 875-125 MG PO TABS: 1.0000 | ORAL_TABLET | Freq: Two times a day (BID) | ORAL | 0 refills | Status: AC

## 2021-07-22 NOTE — ED Notes (Signed)
See triage note  Presents with some blood in urine  Noticed this am   Denies any fever ,n/v or penile discharge

## 2021-07-22 NOTE — ED Provider Notes (Signed)
[facility] Emergency Department Provider Note  ____________________________________________   Event Date/Time   First MD Initiated Contact with Patient 07/22/21 1255     (approximate)  I have reviewed the triage vital signs and the nursing notes.   HISTORY  Chief Complaint Hematuria    HPI Wayne Lane is a 35 y.o. male presents emergency department complaining of blood in his urine.  States some irritation.  States some burning when he sees the blood but otherwise not.  States he has not had sex in years.  He has a STD.  No fever or chills.  Denies abdominal pain  Past Medical History:  Diagnosis Date   History of seizures as a child     Patient Active Problem List   Diagnosis Date Noted   Allergic rhinitis 04/18/2019   Asthma 04/18/2019    Past Surgical History:  Procedure Laterality Date   heart murmur     NO PAST SURGERIES     TONSILLECTOMY      Prior to Admission medications   Medication Sig Start Date End Date Taking? Authorizing Provider  amoxicillin-clavulanate (AUGMENTIN) 875-125 MG tablet Take 1 tablet by mouth 2 (two) times daily for 7 days. 07/22/21 07/29/21 Yes Jacquelyn Shadrick, Roselyn Bering, PA-C  Albuterol Sulfate 108 (90 Base) MCG/ACT AEPB Inhale into the lungs 2 (two) times daily. As needed    [provider]  neomycin-polymyxin-pramoxine (NEOSPORIN PLUS) 1 % cream Apply topically 2 (two) times daily. 07/03/20   Joni Reining, PA-C  sulfamethoxazole-trimethoprim (BACTRIM DS) 800-160 MG tablet Take 1 tablet by mouth 2 (two) times daily. 07/03/20   Joni Reining, PA-C    Allergies Valium [diazepam]  Family History  Problem Relation Age of Onset   Healthy Mother    Hypertension Father    Hypertension Brother     Social History Social History   Tobacco Use   Smoking status: Every Day    Packs/day: 0.25    Types: Cigarettes   Smokeless tobacco: Never  Vaping Use   Vaping Use: Every day  Substance Use Topics    Alcohol use: Yes    Comment: 1-2 drinks of liquor a day   Drug use: Yes    Types: Marijuana    Review of Systems  Constitutional: No fever/chills Eyes: No visual changes. ENT: No sore throat. Respiratory: Denies cough Cardiovascular: Denies chest pain Gastrointestinal: Denies abdominal pain Genitourinary: Negative for dysuria. Musculoskeletal: Negative for back pain. Skin: Negative for rash. Psychiatric: no mood changes,     ____________________________________________   PHYSICAL EXAM:  VITAL SIGNS: ED Triage Vitals [07/22/21 1130]  Enc Vitals Group     BP 123/85     Pulse Rate 85     Resp 16     Temp 98.3 F (36.8 C)     Temp Source Oral     SpO2 100 %     Weight 150 lb (68 kg)     Height 6\' 2"  (1.88 m)     Head Circumference      Peak Flow      Pain Score 0     Pain Loc      Pain Edu?      Excl. in GC?     Constitutional: Alert and oriented. Well appearing and in no acute distress. Eyes: Conjunctivae are normal.  Head: Atraumatic. Nose: No congestion/rhinnorhea. Mouth/Throat: Mucous membranes are moist.   Neck:  supple no lymphadenopathy noted Cardiovascular: Normal rate, regular rhythm. Heart sounds are  normal Respiratory: Normal respiratory effort.  No retractions, lungs c t a  Abd: soft nontender bs normal all 4 quad, no CVA tenderness GU: deferred Musculoskeletal: FROM all extremities, warm and well perfused Neurologic:  Normal speech and language.  Skin:  Skin is warm, dry and intact. No rash noted. Psychiatric: Mood and affect are normal. Speech and behavior are normal.  ____________________________________________   LABS (all labs ordered are listed, but only abnormal results are displayed)  Labs Reviewed  URINALYSIS, COMPLETE (UACMP) WITH MICROSCOPIC - Abnormal; Notable for the following components:      Result Value   Specific Gravity, Urine >1.030 (*)    Ketones, ur TRACE (*)    Leukocytes,Ua LARGE (*)    Bacteria, UA RARE (*)     All other components within normal limits   ____________________________________________   ____________________________________________  RADIOLOGY    ____________________________________________   PROCEDURES  Procedure(s) performed: No  Procedures    ____________________________________________   INITIAL IMPRESSION / ASSESSMENT AND PLAN / ED COURSE  Pertinent labs & imaging results that were available during my care of the patient were reviewed by me and considered in my medical decision making (see chart for details).   Patient is a 35 year old male presents emergency department with hematuria.  See HPI.  Physical exam shows patient appears stable.  Urinalysis does show a large amount of leuks, rare bacteria.  Did explain the findings to the patient.  He is placed on a antibiotic.  Explained to him that he needs to follow-up with his regular doctor if not improved in 3 days.  Return emergency department worsening.  He was discharged stable condition.     CAEDEN Lane was evaluated in Emergency Department on 07/22/2021 for the symptoms described in the history of present illness. He was evaluated in the context of the global COVID-19 pandemic, which necessitated consideration that the patient might be at risk for infection with the SARS-CoV-2 virus that causes COVID-19. Institutional protocols and algorithms that pertain to the evaluation of patients at risk for COVID-19 are in a state of rapid change based on information released by regulatory bodies including the CDC and federal and state organizations. These policies and algorithms were followed during the patient's care in the ED.    As part of my medical decision making, I reviewed the following data within the electronic MEDICAL RECORD NUMBER Nursing notes reviewed and incorporated, Labs reviewed , Old chart reviewed, Notes from prior ED visits, and  Controlled Substance  Database  ____________________________________________   FINAL CLINICAL IMPRESSION(S) / ED DIAGNOSES  Final diagnoses:  Infective urethritis      NEW MEDICATIONS STARTED DURING THIS VISIT:  Discharge Medication List as of 07/22/2021  1:02 PM     START taking these medications   Details  amoxicillin-clavulanate (AUGMENTIN) 875-125 MG tablet Take 1 tablet by mouth 2 (two) times daily for 7 days., Starting Wed 07/22/2021, Until Wed 07/29/2021, Normal         Note:  This document was prepared using Dragon voice recognition software and may include unintentional dictation errors.    Faythe Ghee, PA-C 07/22/21 1527    Gilles Chiquito, MD 07/22/21 [phone]

## 2021-07-22 NOTE — ED Triage Notes (Signed)
Pt states he noticed blood in his urine today, denies having any pain at present. Denies any other sx

## 2021-08-27 ENCOUNTER — Emergency Department: Payer: Self-pay

## 2021-08-27 ENCOUNTER — Other Ambulatory Visit: Payer: Self-pay

## 2021-08-27 ENCOUNTER — Emergency Department
Admission: EM | Admit: 2021-08-27 | Discharge: 2021-08-27 | Disposition: A | Discharge status: Home / Self Care | Payer: Self-pay | Attending: Emergency Medicine | Admitting: Emergency Medicine

## 2021-08-27 DIAGNOSIS — W57XXXA Bitten or stung by nonvenomous insect and other nonvenomous arthropods, initial encounter: Secondary | ICD-10-CM | POA: Insufficient documentation

## 2021-08-27 DIAGNOSIS — J45909 Unspecified asthma, uncomplicated: Secondary | ICD-10-CM | POA: Insufficient documentation

## 2021-08-27 DIAGNOSIS — F17210 Nicotine dependence, cigarettes, uncomplicated: Secondary | ICD-10-CM | POA: Insufficient documentation

## 2021-08-27 DIAGNOSIS — R21 Rash and other nonspecific skin eruption: Secondary | ICD-10-CM

## 2021-08-27 DIAGNOSIS — M7989 Other specified soft tissue disorders: Secondary | ICD-10-CM | POA: Insufficient documentation

## 2021-08-27 DIAGNOSIS — S80862A Insect bite (nonvenomous), left lower leg, initial encounter: Secondary | ICD-10-CM | POA: Insufficient documentation

## 2021-08-27 NOTE — ED Notes (Signed)
See triage note  presents with swelling to left leg  he noticed swelling about 1 week ago  denies any injury  ambulates with slight limp d/t pain

## 2021-08-27 NOTE — ED Provider Notes (Signed)
[facility] EMERGENCY DEPARTMENT Provider Note   CSN: [id number] Arrival date & time: 08/27/21  1316     History Chief Complaint  Patient presents with   Leg Swelling    Wayne Lane is a 35 y.o. male.  Presents to the emergency department evaluation of left leg swelling.  Swelling is been present along the left anterior lower ankle x3 days.  Denies any trauma or injury.  Has had a little bit of a red rash circumferential along the distal portion of the lower leg just above the ankle with no joint swelling.  Denies any calf pain.  He does smoke but no history of blood clots.  He is ambulatory with no pain.  Denies any chest pain or shortness of breath.  HPI     Past Medical History:  Diagnosis Date   History of seizures as a child     Patient Active Problem List   Diagnosis Date Noted   Allergic rhinitis 04/18/2019   Asthma 04/18/2019    Past Surgical History:  Procedure Laterality Date   heart murmur     NO PAST SURGERIES     TONSILLECTOMY         Family History  Problem Relation Age of Onset   Healthy Mother    Hypertension Father    Hypertension Brother     Social History   Tobacco Use   Smoking status: Every Day    Packs/day: 0.25    Types: Cigarettes   Smokeless tobacco: Never  Vaping Use   Vaping Use: Every day  Substance Use Topics   Alcohol use: Yes    Comment: 1-2 drinks of liquor a day   Drug use: Yes    Types: Marijuana    Home Medications Prior to Admission medications   Medication Sig Start Date End Date Taking? Authorizing Provider  Albuterol Sulfate 108 (90 Base) MCG/ACT AEPB Inhale into the lungs 2 (two) times daily. As needed    [provider]    Allergies    Valium [diazepam]  Review of Systems   Review of Systems  Constitutional:  Negative for chills, fatigue and fever.  Respiratory:  Negative for shortness of breath.   Cardiovascular:  Negative for chest pain.  Gastrointestinal:   Negative for nausea and vomiting.  Musculoskeletal:  Negative for back pain, gait problem and joint swelling.  Skin:  Positive for rash. Negative for color change and wound.  Neurological:  Negative for headaches.   Physical Exam Updated Vital Signs BP 110/80 (BP Location: Left Arm)   Pulse 80   Temp 98.4 F (36.9 C) (Oral)   Resp 18   Ht 6' (1.829 m)   Wt 72.6 kg   SpO2 100%   BMI 21.70 kg/m   Physical Exam Constitutional:      Appearance: He is well-developed.  HENT:     Head: Normocephalic and atraumatic.  Eyes:     Conjunctiva/sclera: Conjunctivae normal.  Cardiovascular:     Rate and Rhythm: Normal rate.  Pulmonary:     Effort: Pulmonary effort is normal. No respiratory distress.  Musculoskeletal:        General: Normal range of motion.     Cervical back: Normal range of motion.     Comments: Left lower extremity shows 2+ dorsalis pedis pulses.  Ankle plantarflexion dorsiflexion is intact.  No ankle effusion but does have some circumferential erythema just above the ankle joint, 3 cm diameter band along the medial anterior and  lateral aspect of the distal tib-fib region.  There is no calf pain, swelling, tenderness erythema or edema.  No warmth.  No tenderness to palpation.  No skin breakdown noted.  No bite marks.    Skin:    General: Skin is warm.     Findings: No rash.  Neurological:     General: No focal deficit present.     Mental Status: He is alert and oriented to person, place, and time.  Psychiatric:        Mood and Affect: Mood normal.        Behavior: Behavior normal.        Thought Content: Thought content normal.    ED Results / Procedures / Treatments   Labs (all labs ordered are listed, but only abnormal results are displayed) Labs Reviewed - No data to display  EKG None  Radiology DG Ankle Complete Left  Result Date: 08/27/2021 CLINICAL DATA:  Pain and swelling.  Left ankle pain EXAM: LEFT ANKLE COMPLETE - 3+ VIEW COMPARISON:  None.  FINDINGS: There is no evidence of fracture, dislocation, or joint effusion. There is no evidence of arthropathy or other focal bone abnormality. Mild medial subcutaneus soft tissue edema. IMPRESSION: No acute displaced fracture or dislocation. Electronically Signed   By: Tish Frederickson M.D.   On: 08/27/2021 18:19   US Venous Img Lower Unilateral Left  Result Date: 08/27/2021 CLINICAL DATA:  Left lower extremity pain and edema. EXAM: LEFT LOWER EXTREMITY VENOUS DOPPLER ULTRASOUND TECHNIQUE: Gray-scale sonography with graded compression, as well as color Doppler and duplex ultrasound were performed to evaluate the lower extremity deep venous systems from the level of the common femoral vein and including the common femoral, femoral, profunda femoral, popliteal and calf veins including the posterior tibial, peroneal and gastrocnemius veins when visible. The superficial great saphenous vein was also interrogated. Spectral Doppler was utilized to evaluate flow at rest and with distal augmentation maneuvers in the common femoral, femoral and popliteal veins. COMPARISON:  None. FINDINGS: Contralateral Common Femoral Vein: Respiratory phasicity is normal and symmetric with the symptomatic side. No evidence of thrombus. Normal compressibility. Common Femoral Vein: No evidence of thrombus. Normal compressibility, respiratory phasicity and response to augmentation. Saphenofemoral Junction: No evidence of thrombus. Normal compressibility and flow on color Doppler imaging. Profunda Femoral Vein: No evidence of thrombus. Normal compressibility and flow on color Doppler imaging. Femoral Vein: No evidence of thrombus. Normal compressibility, respiratory phasicity and response to augmentation. Popliteal Vein: No evidence of thrombus. Normal compressibility, respiratory phasicity and response to augmentation. Calf Veins: No evidence of thrombus. Normal compressibility and flow on color Doppler imaging. Other Findings:  None.  IMPRESSION: Negative for deep venous thrombosis in left lower extremity. Electronically Signed   By: Richarda Overlie M.D.   On: 08/27/2021 15:34    Procedures Procedures   Medications Ordered in ED Medications - No data to display  ED Course  I have reviewed the triage vital signs and the nursing notes.  Pertinent labs & imaging results that were available during my care of the patient were reviewed by me and considered in my medical decision making (see chart for details).  MDM Rules/Calculators/A&P                          Final Clinical Impression(s) / ED Diagnoses Final diagnoses:  Insect bite of left lower extremity, initial encounter  Rash  35 year old male with erythema to the left lower leg with  mild swelling.  No signs of abscess formation.  Ultrasound was negative for DVT.  X-ray showed no evidence of arthropathy or abnormal bony lesions or fracture.  Area of erythema and swelling is focal, does not appear to be any type of gout infection or cellulitis due to not being tender to touch or warm.  Suspect possible local reaction to possible insect bite.  He is currently on Augmentin for a separate medical issue, will continue with this antibiotic.  Recommend hydrocortisone cream, elevation.  He understands signs and symptoms return to the ER for.    Rx / DC Orders ED Discharge Orders     None        Ronnette Juniper 08/27/21 Byrd Hesselbach, MD 08/27/21 1946

## 2021-08-27 NOTE — Discharge Instructions (Signed)
Please apply ice to the left lower leg.  You may apply hydrocortisone cream twice daily.  Continue with Augmentin.  Return to the ER for any fevers, increasing pain swelling warmth or redness.

## 2021-08-27 NOTE — ED Provider Notes (Signed)
Emergency Medicine Provider Triage Evaluation Note  GEVORG BRUM , a 35 y.o. male  was evaluated in triage.  Pt complains of left lower leg .  Review of Systems  Positive: Left lower leg swelling, leg feels tight Negative: Chest pain or shortness of breath  Physical Exam  BP 109/89 (BP Location: Left Arm)   Pulse 84   Temp 98.4 F (36.9 C) (Oral)   Resp 18   Ht 6' (1.829 m)   Wt 72.6 kg   SpO2 100%   BMI 21.70 kg/m  Gen:   Awake, no distress   Resp:  Normal effort  MSK:   Moves extremities without difficulty, minimal swelling noted to left lower extremity, slightly tender Other:    Medical Decision Making  Medically screening exam initiated at 1:47 PM.  Appropriate orders placed.  TARELL SCHOLLMEYER was informed that the remainder of the evaluation will be completed by another provider, this initial triage assessment does not replace that evaluation, and the importance of remaining in the ED until their evaluation is complete.     Faythe Ghee, PA-C 08/27/21 1348    Sharman Cheek, MD 08/27/21 1946

## 2021-08-27 NOTE — ED Triage Notes (Signed)
Pt here with left leg swelling for about a week. Pt states that his leg started off as just swelling but now has pain in it. Pt still able to ambulate with the affected leg. Pt in NAD in triage.

## 2022-08-27 IMAGING — DX DG ANKLE COMPLETE 3+V*L*
3 series · 3 of 3 positions shown · non-contrast
Comparison: None.

CLINICAL DATA: Pain and swelling.  Left ankle pain

EXAM:
LEFT ANKLE COMPLETE - 3+ VIEW

[ankle ap]
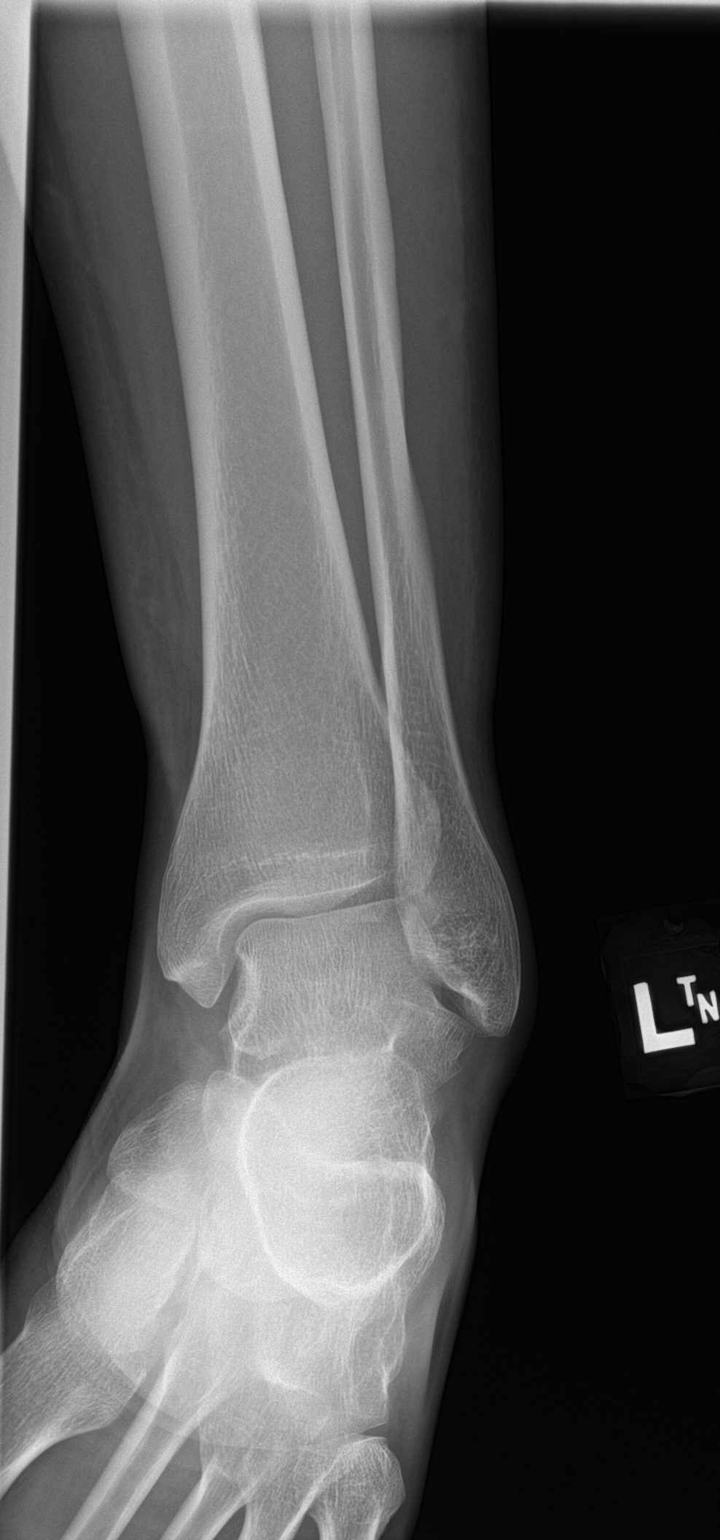

[ankle obl]
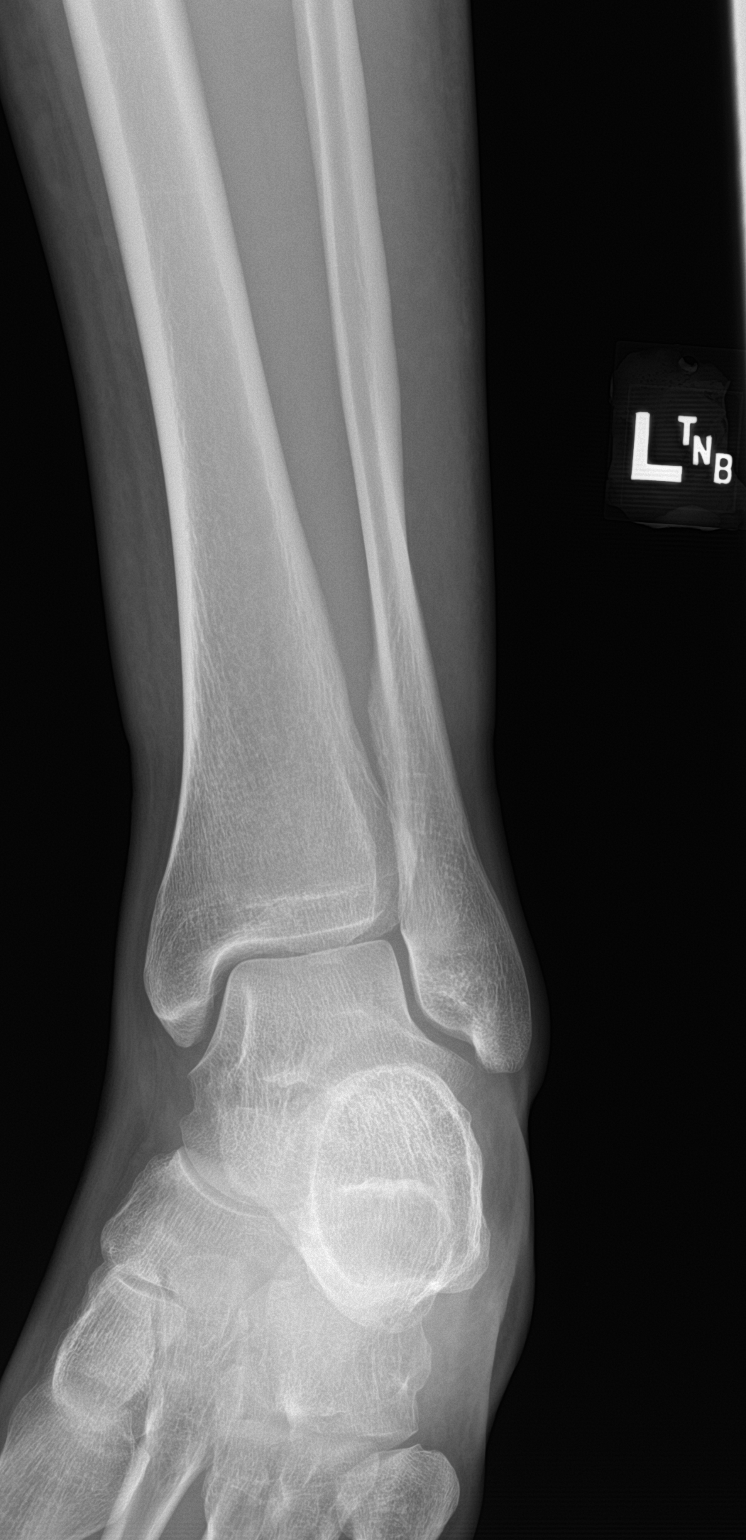

[ankle lat]
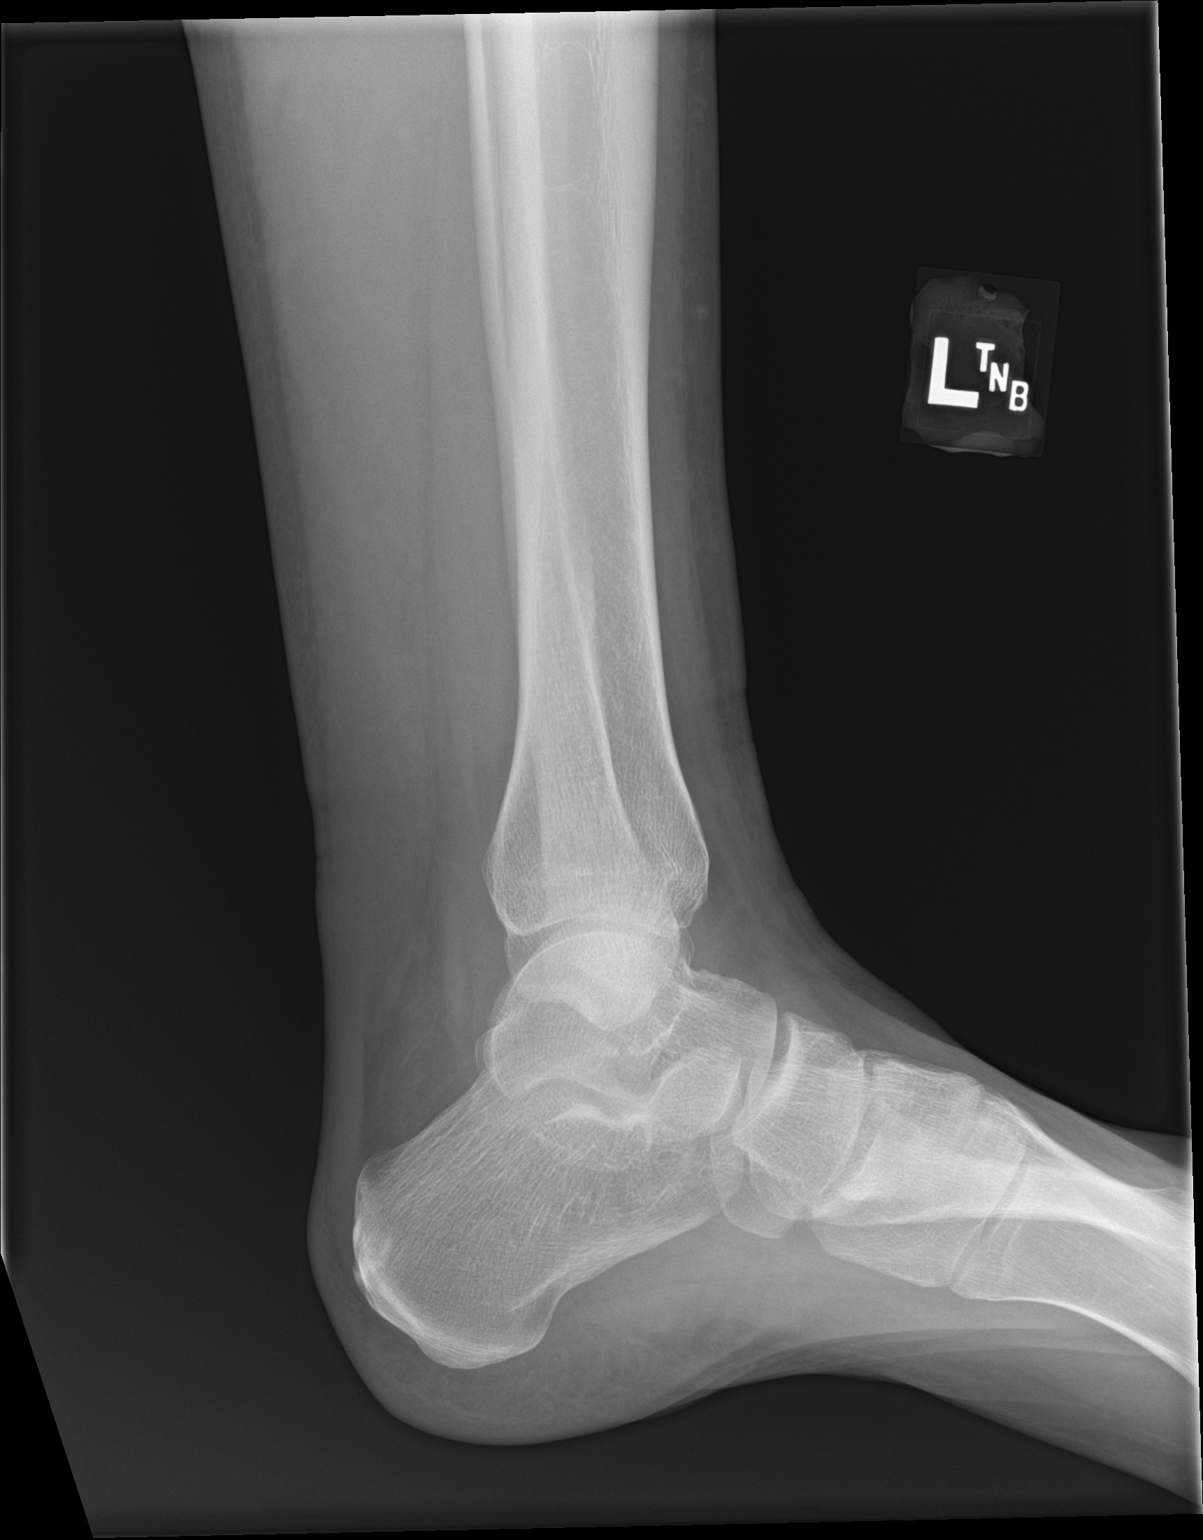

[3 of 3 positions shown; findings below may reference images not displayed]

FINDINGS: There is no evidence of fracture, dislocation, or joint effusion.
There is no evidence of arthropathy or other focal bone abnormality.
Mild medial subcutaneus soft tissue edema.
IMPRESSION: No acute displaced fracture or dislocation.

## 2022-08-27 IMAGING — US US EXTREM LOW VENOUS*L*
1 series · 13 of 24 positions shown · non-contrast
Comparison: None.

CLINICAL DATA: Left lower extremity pain and edema.



[Series 1: us venous img lower uni left (dvt) · portal-venous · 13 of 33 slices shown]
[im 1/33]
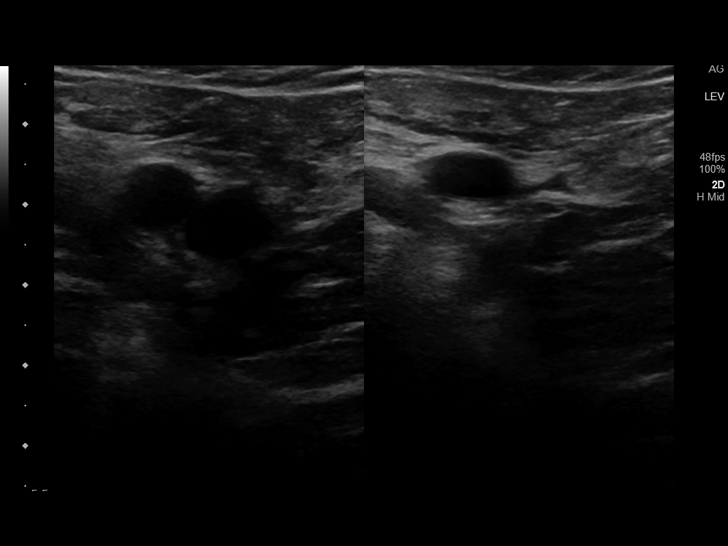
[im 3/33]
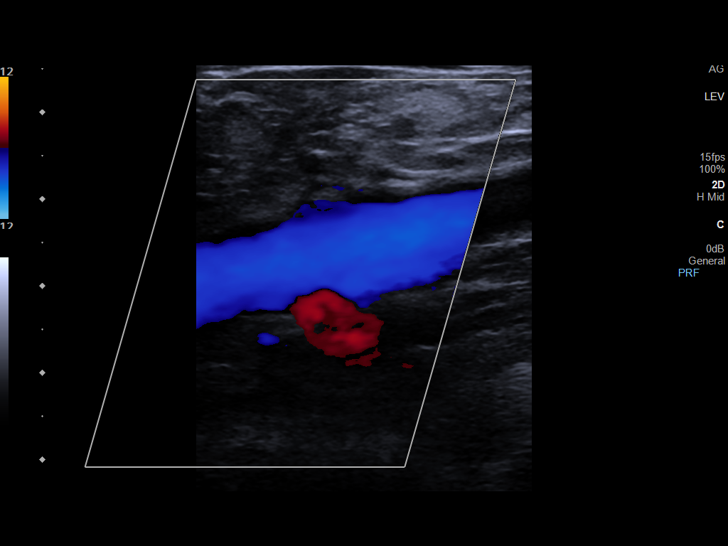
[im 6/33]
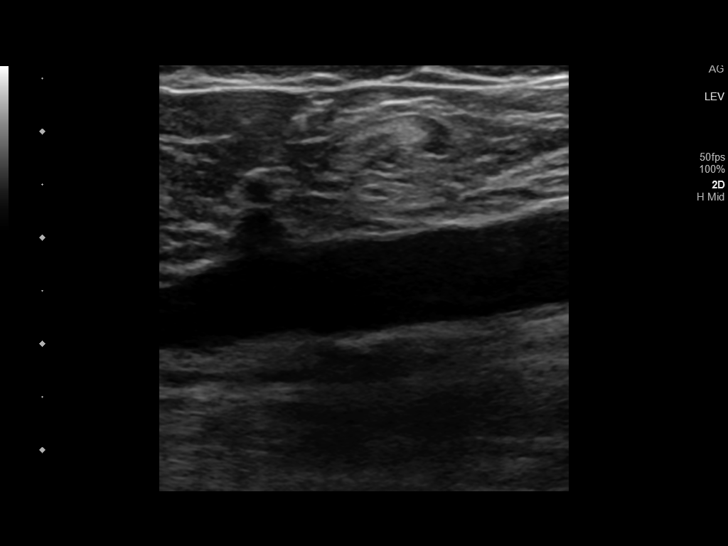
[im 9/33]
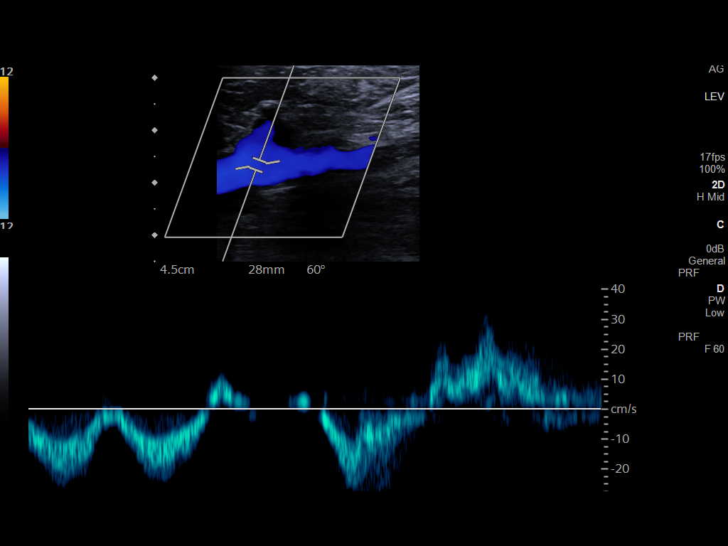
[im 12/33]
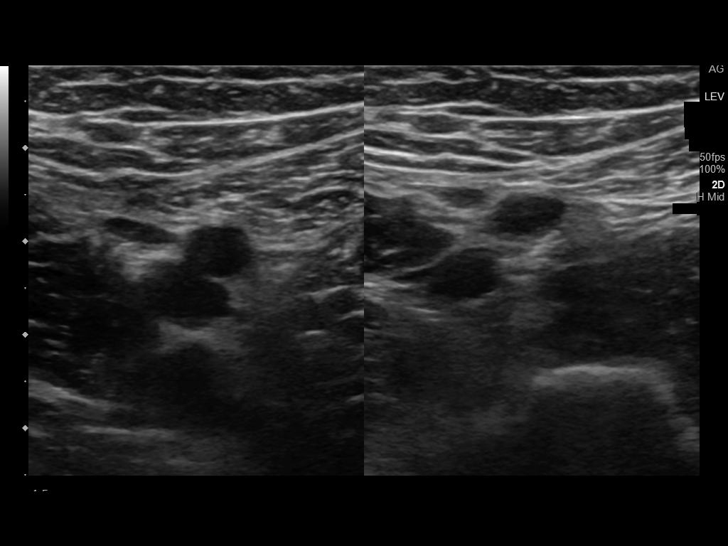
[im 14/33]
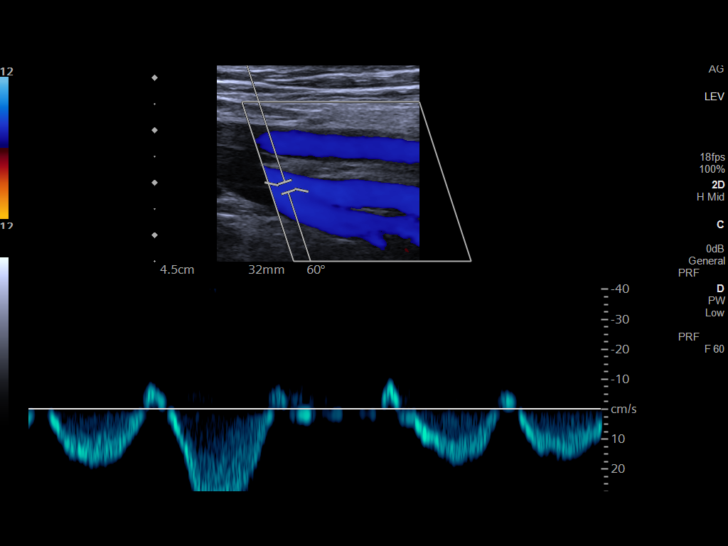
[im 17/33]
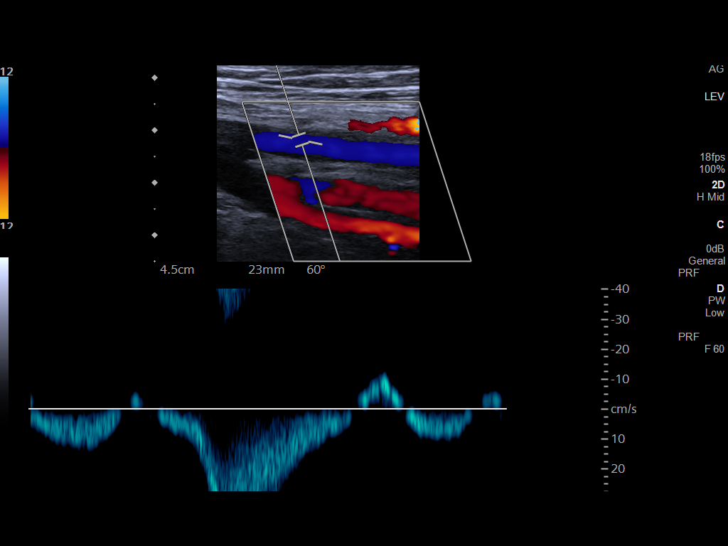
[im 19/33]
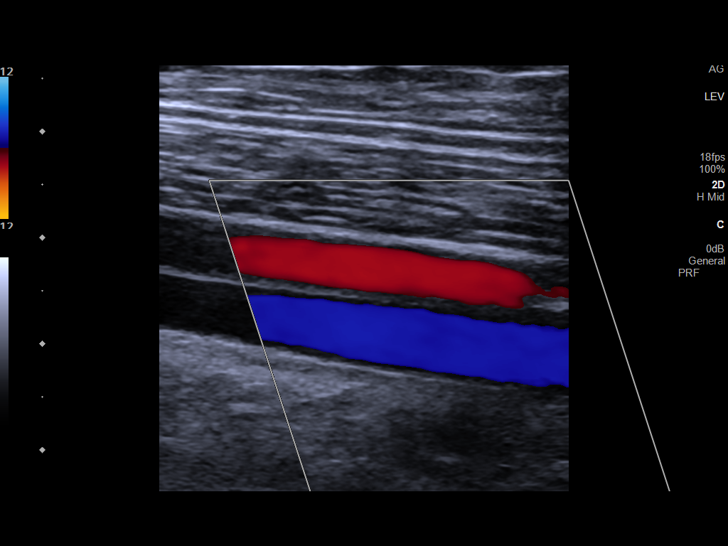
[im 21/33]
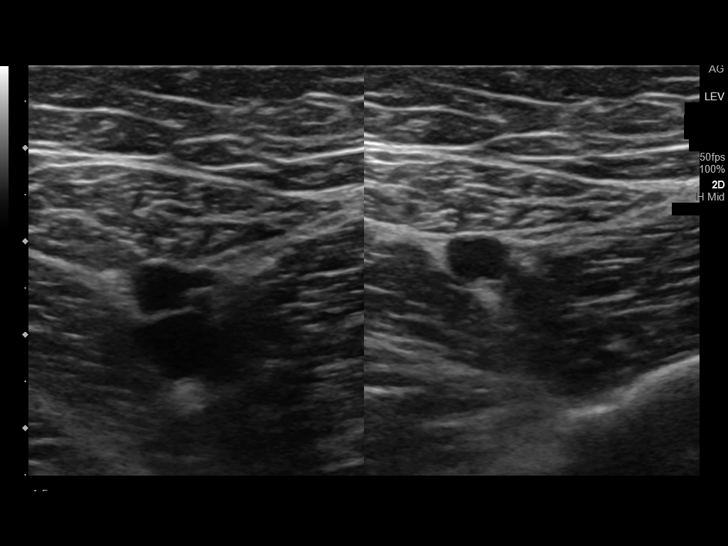
[im 24/33]
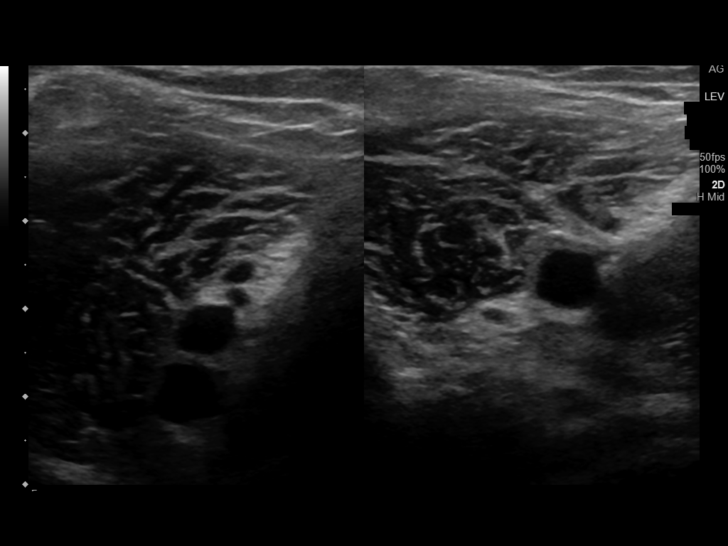
[im 27/33]
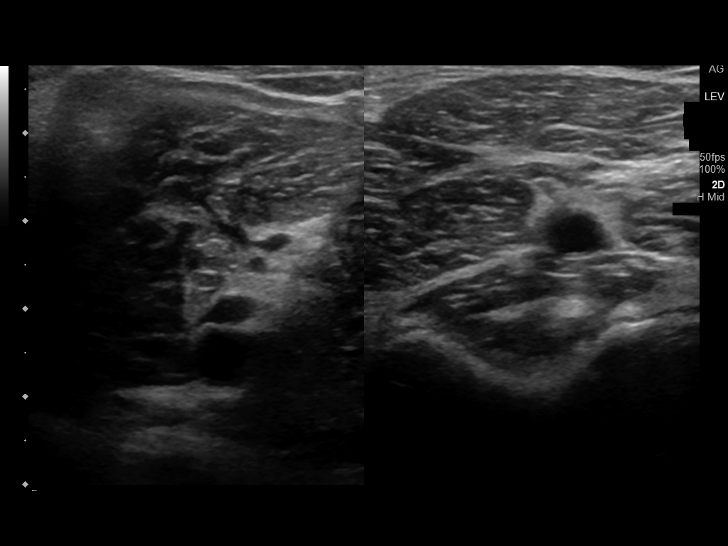
[im 30/33]
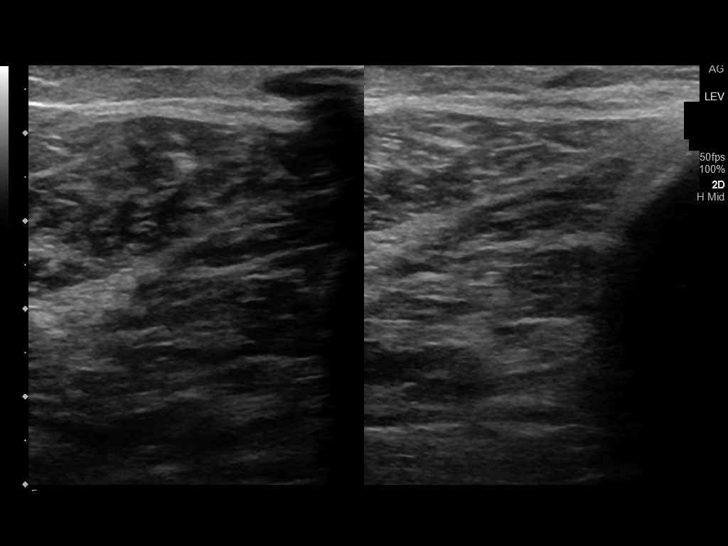
[im 33/33]
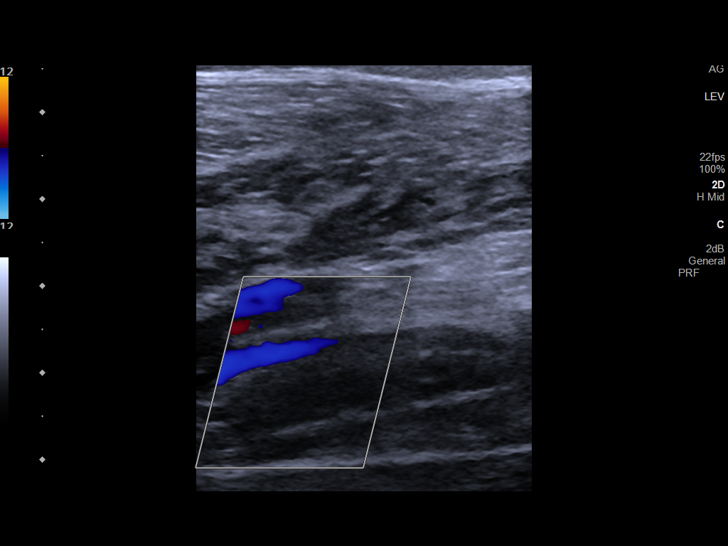

[13 of 24 positions shown; findings below may reference images not displayed]

FINDINGS: Contralateral Common Femoral Vein: Respiratory phasicity is normal
and symmetric with the symptomatic side. No evidence of thrombus.
Normal compressibility.

Common Femoral Vein: No evidence of thrombus. Normal
compressibility, respiratory phasicity and response to augmentation.

Saphenofemoral Junction: No evidence of thrombus. Normal
compressibility and flow on color Doppler imaging.

Profunda Femoral Vein: No evidence of thrombus. Normal
compressibility and flow on color Doppler imaging.

Femoral Vein: No evidence of thrombus. Normal compressibility,
respiratory phasicity and response to augmentation.

Popliteal Vein: No evidence of thrombus. Normal compressibility,
respiratory phasicity and response to augmentation.

Calf Veins: No evidence of thrombus. Normal compressibility and flow
on color Doppler imaging.

Other Findings:  None.
IMPRESSION: Negative for deep venous thrombosis in left lower extremity.

## 2023-10-13 ENCOUNTER — Other Ambulatory Visit: Payer: Self-pay

## 2023-10-13 ENCOUNTER — Emergency Department: Payer: Self-pay

## 2023-10-13 ENCOUNTER — Emergency Department
Admission: EM | Admit: 2023-10-13 | Discharge: 2023-10-13 | Disposition: A | Discharge status: Home / Self Care | Payer: Self-pay | Attending: Emergency Medicine | Admitting: Emergency Medicine

## 2023-10-13 DIAGNOSIS — W228XXA Striking against or struck by other objects, initial encounter: Secondary | ICD-10-CM | POA: Insufficient documentation

## 2023-10-13 DIAGNOSIS — S62307B Unspecified fracture of fifth metacarpal bone, left hand, initial encounter for open fracture: Secondary | ICD-10-CM

## 2023-10-13 DIAGNOSIS — Z23 Encounter for immunization: Secondary | ICD-10-CM | POA: Insufficient documentation

## 2023-10-13 DIAGNOSIS — S62327B Displaced fracture of shaft of fifth metacarpal bone, left hand, initial encounter for open fracture: Secondary | ICD-10-CM | POA: Insufficient documentation

## 2023-10-13 MED ORDER — CEPHALEXIN 500 MG PO CAPS: 500.0000 mg | ORAL_CAPSULE | Freq: Three times a day (TID) | ORAL | 0 refills | Status: AC

## 2023-10-13 MED ORDER — LIDOCAINE HCL (PF) 1 % IJ SOLN
5.0000 mL | Freq: Once | INTRAMUSCULAR | Status: AC
  Administered 2023-10-13: 5 mL
  Filled 2023-10-13: qty 5

## 2023-10-13 MED ORDER — TETANUS-DIPHTH-ACELL PERTUSSIS 5-2.5-18.5 LF-MCG/0.5 IM SUSY
0.5000 mL | PREFILLED_SYRINGE | Freq: Once | INTRAMUSCULAR | Status: AC
  Administered 2023-10-13: 0.5 mL via INTRAMUSCULAR
  Filled 2023-10-13: qty 0.5

## 2023-10-13 MED ORDER — HYDROCODONE-ACETAMINOPHEN 5-325 MG PO TABS: 1.0000 | ORAL_TABLET | Freq: Four times a day (QID) | ORAL | 0 refills | Status: AC | PRN

## 2023-10-13 MED ORDER — CEFAZOLIN SODIUM 1 G IM
1.0000 g | Freq: Once | INTRAMUSCULAR | Status: AC
  Administered 2023-10-13: 1 g via INTRAMUSCULAR
  Filled 2023-10-13: qty 1000

## 2023-10-13 NOTE — ED Provider Notes (Signed)
[facility] Provider Note    Event Date/Time   First MD Initiated Contact with Patient 10/13/23 1721     (approximate)   History   Laceration   HPI  Wayne Lane is a 37 y.o. male   to ED with complaint of laceration to his left hand after intentionally hitting a piece of wood after being after being frustrated.  Patient reports that it has been over 8 years since he has a known tetanus immunization.  Denies history of asthma.      Physical Exam   Triage Vital Signs: ED Triage Vitals  Encounter Vitals Group     BP 10/13/23 1652 (!) 147/96     Systolic BP Percentile --      Diastolic BP Percentile --      Pulse Rate 10/13/23 1652 (!) 112     Resp 10/13/23 1652 12     Temp 10/13/23 1652 98.6 F (37 C)     Temp src --      SpO2 10/13/23 1652 95 %     Weight 10/13/23 1649 155 lb (70.3 kg)     Height 10/13/23 1649 6\' 2"  (1.88 m)     Head Circumference --      Peak Flow --      Pain Score 10/13/23 1649 7     Pain Loc --      Pain Education --      Exclude from Growth Chart --     Most recent vital signs: Vitals:   10/13/23 1652  BP: (!) 147/96  Pulse: (!) 112  Resp: 12  Temp: 98.6 F (37 C)  SpO2: 95%     General: Awake, no distress.  CV:  Good peripheral perfusion.  Resp:  Normal effort.  Abd:  No distention.  Other:  Moderate swelling and tenderness is noted to the fifth metacarpal midshaft and distally.  Patient is able to move digits distally without any difficulty, motor or sensory function intact, capillary refills less than 3 seconds.   ED Results / Procedures / Treatments   Labs (all labs ordered are listed, but only abnormal results are displayed) Labs Reviewed - No data to display    RADIOLOGY Left hand x-ray images reviewed and interpreted by myself independent of the radiologist and is positive for fracture of the distal fifth metacarpal.    PROCEDURES:  Critical Care performed:   .Laceration  Repair  Date/Time: 10/13/2023 6:00 PM  Performed by: Tommi Rumps, PA-C Authorized by: Tommi Rumps, PA-C   Consent:    Consent obtained:  Verbal   Consent given by:  Patient   Risks discussed:  Infection, pain and poor cosmetic result Universal protocol:    Patient identity confirmed:  Verbally with patient Anesthesia:    Anesthesia method:  Local infiltration   Local anesthetic:  Lidocaine 1% w/o epi Laceration details:    Location:  Hand   Hand location:  L hand, dorsum   Length (cm):  2 Pre-procedure details:    Preparation:  Patient was prepped and draped in usual sterile fashion and imaging obtained to evaluate for foreign bodies Exploration:    Limited defect created (wound extended): no     Hemostasis achieved with:  Direct pressure   Imaging obtained: x-ray     Imaging outcome: foreign body not noted     Contaminated: no   Treatment:    Area cleansed with:  Saline   Amount of cleaning:  Extensive  Irrigation solution:  Sterile saline   Irrigation volume:  200 ml   Irrigation method:  Syringe   Visualized foreign bodies/material removed: no   Skin repair:    Repair method:  Sutures   Suture size:  4-0   Suture material:  Nylon   Suture technique:  Simple interrupted   Number of sutures:  2 Approximation:    Approximation:  Loose Repair type:    Repair type:  Simple Post-procedure details:    Dressing:  Non-adherent dressing   Procedure completion:  Tolerated    MEDICATIONS ORDERED IN ED: Medications  ceFAZolin (ANCEF) injection 1 g (has no administration in time range)  Tdap (BOOSTRIX) injection 0.5 mL (0.5 mLs Intramuscular Given 10/13/23 1729)  lidocaine (PF) (XYLOCAINE) 1 % injection 5 mL (5 mLs Infiltration Given 10/13/23 1830)     IMPRESSION / MDM / ASSESSMENT AND PLAN / ED COURSE  I reviewed the triage vital signs and the nursing notes.   Differential diagnosis includes, but is not limited to, fracture left fifth metacarpal, open  wound, foreign body  37 year old male presents to the ED with laceration and injury to his left hand that occurred after he hit a wooden board several times intentionally.  Patient was made aware that he has an open fracture with a fracture to the distal portion of his fifth metacarpal and laceration corresponding with that.  Patient was given Ancef 1 g IM while in the ED along with tetanus immunization update.  Patient tolerated suturing well.  Dressing and a ulnar gutter splint was applied.  A prescription for Keflex and hydrocodone was sent to the pharmacy for him to begin taking as directed.  Ice and elevation.  He is to follow-up with [facility] orthopedic department by making an appointment on Monday.  I have work note was written for limited use of his left hand until he has been seen by the orthopedist.      Patient's presentation is most consistent with acute illness / injury with system symptoms.  FINAL CLINICAL IMPRESSION(S) / ED DIAGNOSES   Final diagnoses:  Open displaced fracture of fifth metacarpal bone of left hand, unspecified portion of metacarpal, initial encounter     Rx / DC Orders   ED Discharge Orders          Ordered    cephALEXin (KEFLEX) 500 MG capsule  3 times daily        10/13/23 1918    HYDROcodone-acetaminophen (NORCO/VICODIN) 5-325 MG tablet  Every 6 hours PRN        10/13/23 1918             Note:  This document was prepared using Dragon voice recognition software and may include unintentional dictation errors.   Tommi Rumps, PA-C 10/13/23 1931    Pilar Jarvis, MD 10/13/23 [phone]

## 2023-10-13 NOTE — Discharge Instructions (Signed)
Call make an appointment with Dr. Audelia Acton, who is on-call for orthopedics.  His office is located in [facility] and his contact information and address are listed on your discharge papers.  Ice and elevation due to the fracture in your hand.  Keep the area clean and dry.  A prescription for antibiotics was sent to the pharmacy along with a prescription for hydrocodone to take as needed for severe pain.

## 2023-10-13 NOTE — ED Triage Notes (Signed)
Pt presents to ER from home with complaints of laceration to left hand. Pt reports he hit wood out of frustration. Pt has swelling to left hand near 5th digit and bleeding from a laceration. Pt talks in complete sentences no distress noted.
